# Patient Record
Sex: Female | Born: 1990 | Race: Black or African American | Hispanic: No | Marital: Single | Smoking: Never smoker
Health system: Southern US, Community
[De-identification: ages and names within clinical notes are randomized; demographics above are authoritative.]

## PROBLEM LIST (undated history)

## (undated) ENCOUNTER — Inpatient Hospital Stay (HOSPITAL_COMMUNITY): Payer: Self-pay

## (undated) DIAGNOSIS — Z8619 Personal history of other infectious and parasitic diseases: Secondary | ICD-10-CM

## (undated) DIAGNOSIS — Z789 Other specified health status: Secondary | ICD-10-CM

## (undated) HISTORY — PX: NO PAST SURGERIES: SHX2092

---
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
## 2014-03-16 ENCOUNTER — Ambulatory Visit (INDEPENDENT_AMBULATORY_CARE_PROVIDER_SITE_OTHER): Payer: No Typology Code available for payment source | Admitting: Family Medicine

## 2014-03-16 VITALS — BP 120/70 | HR 67 | Temp 97.9°F | Resp 16 | Ht 64.0 in | Wt 153.0 lb

## 2014-03-16 DIAGNOSIS — Z3009 Encounter for other general counseling and advice on contraception: Secondary | ICD-10-CM

## 2014-03-16 DIAGNOSIS — Z0000 Encounter for general adult medical examination without abnormal findings: Secondary | ICD-10-CM

## 2014-03-16 DIAGNOSIS — Z113 Encounter for screening for infections with a predominantly sexual mode of transmission: Secondary | ICD-10-CM

## 2014-03-16 DIAGNOSIS — N644 Mastodynia: Secondary | ICD-10-CM

## 2014-03-16 DIAGNOSIS — Z30011 Encounter for initial prescription of contraceptive pills: Secondary | ICD-10-CM

## 2014-03-16 LAB — POCT UA - MICROSCOPIC ONLY
Casts, Ur, LPF, POC: NEGATIVE
Crystals, Ur, HPF, POC: NEGATIVE
Yeast, UA: NEGATIVE

## 2014-03-16 LAB — POCT URINALYSIS DIPSTICK
Bilirubin, UA: NEGATIVE
Blood, UA: NEGATIVE
GLUCOSE UA: NEGATIVE
KETONES UA: NEGATIVE
Leukocytes, UA: NEGATIVE
Nitrite, UA: NEGATIVE
Protein, UA: NEGATIVE
Spec Grav, UA: 1.02
Urobilinogen, UA: 0.2
pH, UA: 6.5

## 2014-03-16 MED ORDER — NORGESTIMATE-ETH ESTRADIOL 0.25-35 MG-MCG PO TABS
1.0000 | ORAL_TABLET | Freq: Every day | ORAL | Status: DC
Start: 2014-03-16 — End: 2016-05-20

## 2014-03-16 NOTE — Patient Instructions (Addendum)
Plantar Fasciitis Plantar fasciitis is a common condition that causes foot pain. It is soreness (inflammation) of the band of tough fibrous tissue on the bottom of the foot that runs from the heel bone (calcaneus) to the ball of the foot. The cause of this soreness may be from excessive standing, poor fitting shoes, running on hard surfaces, being overweight, having an abnormal walk, or overuse (this is common in runners) of the painful foot or feet. It is also common in aerobic exercise dancers and ballet dancers. SYMPTOMS  Most people with plantar fasciitis complain of:  Severe pain in the morning on the bottom of their foot especially when taking the first steps out of bed. This pain recedes after a few minutes of walking.  Severe pain is experienced also during walking following a long period of inactivity.  Pain is worse when walking barefoot or up stairs DIAGNOSIS   Your caregiver will diagnose this condition by examining and feeling your foot.  Special tests such as X-rays of your foot, are usually not needed. PREVENTION   Consult a sports medicine professional before beginning a new exercise program.  Walking programs offer a good workout. With walking there is a lower chance of overuse injuries common to runners. There is less impact and less jarring of the joints.  Begin all new exercise programs slowly. If problems or pain develop, decrease the amount of time or distance until you are at a comfortable level.  Wear good shoes and replace them regularly.  Stretch your foot and the heel cords at the back of the ankle (Achilles tendon) both before and after exercise.  Run or exercise on even surfaces that are not hard. For example, asphalt is better than pavement.  Do not run barefoot on hard surfaces.  If using a treadmill, vary the incline.  Do not continue to workout if you have foot or joint problems. Seek professional help if they do not improve. HOME CARE INSTRUCTIONS     Avoid activities that cause you pain until you recover.  Use ice or cold packs on the problem or painful areas after working out.  Only take over-the-counter or prescription medicines for pain, discomfort, or fever as directed by your caregiver.  Soft shoe inserts or athletic shoes with air or gel sole cushions may be helpful.  If problems continue or become more severe, consult a sports medicine caregiver or your own health care provider. Cortisone is a potent anti-inflammatory medication that may be injected into the painful area. You can discuss this treatment with your caregiver. MAKE SURE YOU:   Understand these instructions.  Will watch your condition.  Will get help right away if you are not doing well or get worse. Document Released: 05/09/2001 Document Revised: 11/06/2011 Document Reviewed: 07/08/2008 [facility] Patient Information 2015 [place], [place]. This information is not intended to replace advice given to you by your health care provider. Make sure you discuss any questions you have with your health care provider. Oral Contraception Use Oral contraceptive pills (OCPs) are medicines taken to prevent pregnancy. OCPs work by preventing the ovaries from releasing eggs. The hormones in OCPs also cause the cervical mucus to thicken, preventing the sperm from entering the uterus. The hormones also cause the uterine lining to become thin, not allowing a fertilized egg to attach to the inside of the uterus. OCPs are highly effective when taken exactly as prescribed. However, OCPs do not prevent sexually transmitted diseases (STDs). Safe sex practices, such as using condoms along  with an OCP, can help prevent STDs. Before taking OCPs, you may have a physical exam and Pap test. Your health care provider may also order blood tests if necessary. Your health care provider will make sure you are a good candidate for oral contraception. Discuss with your health care provider the possible side  effects of the OCP you may be prescribed. When starting an OCP, it can take 2 to 3 months for the body to adjust to the changes in hormone levels in your body.  HOW TO TAKE ORAL CONTRACEPTIVE PILLS Your health care provider may advise you on how to start taking the first cycle of OCPs. Otherwise, you can:   Start on day 1 of your menstrual period. You will not need any backup contraceptive protection with this start time.   Start on the first Sunday after your menstrual period or the day you get your prescription. In these cases, you will need to use backup contraceptive protection for the first week.   Start the pill at any time of your cycle. If you take the pill within 5 days of the start of your period, you are protected against pregnancy right away. In this case, you will not need a backup form of birth control. If you start at any other time of your menstrual cycle, you will need to use another form of birth control for 7 days. If your OCP is the type called a minipill, it will protect you from pregnancy after taking it for 2 days (48 hours). After you have started taking OCPs:   If you forget to take 1 pill, take it as soon as you remember. Take the next pill at the regular time.   If you miss 2 or more pills, call your health care provider because different pills have different instructions for missed doses. Use backup birth control until your next menstrual period starts.   If you use a 28-day pack that contains inactive pills and you miss 1 of the last 7 pills (pills with no hormones), it will not matter. Throw away the rest of the nonhormone pills and start a new pill pack.  No matter which day you start the OCP, you will always start a new pack on that same day of the week. Have an extra pack of OCPs and a backup contraceptive method available in case you miss some pills or lose your OCP pack.  HOME CARE INSTRUCTIONS   Do not smoke.   Always use a condom to protect against  STDs. OCPs do not protect against STDs.   Use a calendar to mark your menstrual period days.   Read the information and directions that came with your OCP. Talk to your health care provider if you have questions.  SEEK MEDICAL CARE IF:   You develop nausea and vomiting.   You have abnormal vaginal discharge or bleeding.   You develop a rash.   You miss your menstrual period.   You are losing your hair.   You need treatment for mood swings or depression.   You get dizzy when taking the OCP.   You develop acne from taking the OCP.   You become pregnant.  SEEK IMMEDIATE MEDICAL CARE IF:   You develop chest pain.   You develop shortness of breath.   You have an uncontrolled or severe headache.   You develop numbness or slurred speech.   You develop visual problems.   You develop pain, redness, and swelling in the legs.  Document Released: 08/03/2011 Document Revised: 04/16/2013 Document Reviewed: 02/02/2013 [facility] Patient Information 2015 [place], [place]. This information is not intended to replace advice given to you by your health care provider. Make sure you discuss any questions you have with your health care provider.

## 2014-03-16 NOTE — Progress Notes (Signed)
   Subjective:    Patient ID: Crystal Church, female    DOB: 1991-04-27, 23 y.o.   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 She is interested in restarting OCP. She has been with her boyfriend for 1.5 years. She had testing for STI in the past and had gonorrhea. She is interested in being tested today. She had a normal PAP smear 3 years ago.  She has a history of plantar fascitis and has had a flare of left foot after walking around [place] in flip flops. She is aware of symptomatic treatment like ice/stretching/NSAIDs, but has not used any of these.  She reports painful breasts for a long time, right worse than left. Her breast pain is not consistently related to her menstrual cycle. She reports having clinical breast exams and checking her own breast, but not being sure what she is feeling. She is interested in having mammogram. She has an aunt who was diagnosed with breast cancer in her 6620s, and is very concerned that she may have cancer.  Leavy CellaJasmine is studying recreational therapy [employer] and she works at [employer], assisting with tours and orientation. She cooks some meals at her apartment and eats fast food frequently. She rarely exercise.  PMH- seasonal allergies PSH- none FH- parents alive and well SH- No tob, no ETOH, no illicit drugs.   Review of Systems Occasional headaches relieved with ibuprofen, no dysuria/frequency/hematuria, no vaginal discharge/odor/itching/pain, no abdominal pain/nausea/vomiting/diarrhea/constipation.    Objective:   Physical Exam  Vitals reviewed. Constitutional: She is oriented to person, place, and time. She appears well-developed and well-nourished.  HENT:  Head: Normocephalic and atraumatic.  Right Ear: Tympanic membrane, external ear and ear canal normal.  Left Ear: Tympanic membrane, external ear and ear canal normal.  Nose: Nose normal.  Mouth/Throat: Oropharynx is clear and moist.    Eyes: Conjunctivae are normal. Pupils are equal, round, and reactive to light. Right eye exhibits no discharge. Left eye exhibits no discharge.  Neck: Normal range of motion. Neck supple. No thyromegaly present.  Cardiovascular: Normal rate, regular rhythm and normal heart sounds.   Pulmonary/Chest: Effort normal and breath sounds normal. Right breast exhibits no inverted nipple, no mass, no nipple discharge, no skin change and no tenderness. Left breast exhibits no inverted nipple, no mass, no nipple discharge, no skin change and no tenderness. Breasts are symmetrical.  Abdominal: Soft. Bowel sounds are normal.  Musculoskeletal: Normal range of motion.  Lymphadenopathy:    She has no cervical adenopathy.  Neurological: She is alert and oriented to person, place, and time.  Skin: Skin is warm and dry.  Psychiatric: She has a normal mood and affect. Her behavior is normal. Judgment and thought content normal.      Assessment & Plan:  1. Routine screening for STI (sexually transmitted infection) - HIV antibody - GC/Chlamydia Probe Amp - RPR  2. Annual physical exam - POCT urinalysis dipstick - POCT UA - Microscopic Only  3. Encounter for initial prescription of contraceptive pills - norgestimate-ethinyl estradiol (ORTHO-CYCLEN,SPRINTEC,PREVIFEM) 0.25-35 MG-MCG tablet; Take 1 tablet by mouth daily.  Dispense: 3 Package; Refill: 4  4. Pain of both breasts - US Breast Bilateral; Future  -Encouraged healthy habits- exercise 3-4x a week, cook more, less fast food.  Emi Belfasteborah B. Gessner, FNP-BC  [facility]  03/17/2014 8:19 AM

## 2014-03-17 LAB — GC/CHLAMYDIA PROBE AMP
CT PROBE, AMP APTIMA: NEGATIVE
GC PROBE AMP APTIMA: NEGATIVE

## 2014-03-17 LAB — HIV ANTIBODY (ROUTINE TESTING W REFLEX): HIV: NONREACTIVE

## 2014-03-17 LAB — RPR

## 2014-03-17 NOTE — Addendum Note (Signed)
Addended by: Eddie CandleLUCK, Tiegan Terpstra L on: 03/17/2014 03:29 PM   Modules accepted: Orders

## 2014-03-19 ENCOUNTER — Other Ambulatory Visit: Payer: Self-pay | Admitting: Family Medicine

## 2014-03-19 DIAGNOSIS — N644 Mastodynia: Secondary | ICD-10-CM

## 2015-12-24 LAB — OB RESULTS CONSOLE HIV ANTIBODY (ROUTINE TESTING): HIV: NONREACTIVE

## 2015-12-24 LAB — OB RESULTS CONSOLE HEPATITIS B SURFACE ANTIGEN: Hepatitis B Surface Ag: NEGATIVE

## 2015-12-24 LAB — OB RESULTS CONSOLE GC/CHLAMYDIA
CHLAMYDIA, DNA PROBE: NEGATIVE
GC PROBE AMP, GENITAL: NEGATIVE

## 2015-12-24 LAB — OB RESULTS CONSOLE RPR: RPR: NONREACTIVE

## 2015-12-24 LAB — OB RESULTS CONSOLE ABO/RH: "RH Type ": POSITIVE

## 2015-12-24 LAB — OB RESULTS CONSOLE ANTIBODY SCREEN: ANTIBODY SCREEN: NEGATIVE

## 2015-12-24 LAB — OB RESULTS CONSOLE RUBELLA ANTIBODY, IGM: Rubella: IMMUNE

## 2016-05-19 ENCOUNTER — Inpatient Hospital Stay (HOSPITAL_COMMUNITY)
Admission: AD | Admit: 2016-05-19 | Discharge: 2016-05-20 | Disposition: A | Discharge status: Home / Self Care | Payer: BLUE CROSS/BLUE SHIELD | Source: Ambulatory Visit | Attending: Obstetrics and Gynecology | Admitting: Obstetrics and Gynecology

## 2016-05-19 ENCOUNTER — Encounter (HOSPITAL_COMMUNITY): Payer: Self-pay | Admitting: *Deleted

## 2016-05-19 DIAGNOSIS — O26893 Other specified pregnancy related conditions, third trimester: Secondary | ICD-10-CM | POA: Diagnosis not present

## 2016-05-19 DIAGNOSIS — R519 Headache, unspecified: Secondary | ICD-10-CM

## 2016-05-19 DIAGNOSIS — R51 Headache: Secondary | ICD-10-CM | POA: Insufficient documentation

## 2016-05-19 DIAGNOSIS — Z3A30 30 weeks gestation of pregnancy: Secondary | ICD-10-CM | POA: Diagnosis not present

## 2016-05-19 HISTORY — DX: Personal history of other infectious and parasitic diseases: Z86.19

## 2016-05-19 LAB — URINALYSIS, ROUTINE W REFLEX MICROSCOPIC
Bilirubin Urine: NEGATIVE
Glucose, UA: 500 mg/dL — AB
Hgb urine dipstick: NEGATIVE
Ketones, ur: NEGATIVE mg/dL
LEUKOCYTES UA: NEGATIVE
NITRITE: NEGATIVE
PH: 6 (ref 5.0–8.0)
Protein, ur: NEGATIVE mg/dL
SPECIFIC GRAVITY, URINE: 1.02 (ref 1.005–1.030)

## 2016-05-19 MED ORDER — ACETAMINOPHEN 500 MG PO TABS
1000.0000 mg | ORAL_TABLET | Freq: Once | ORAL | Status: AC
  Administered 2016-05-20: 1000 mg via ORAL
  Filled 2016-05-19: qty 2

## 2016-05-19 NOTE — MAU Note (Signed)
Started a wk ago THurs and I felt loss of energy and weak. Monday and Tues I vomited some. Today I had loss of energy again, really hungry, tired. Denies LOF or bleeding. Had some abd pain off and on that were sharp but not now. Just have a headache

## 2016-05-19 NOTE — MAU Provider Note (Signed)
History     CSN: [id number]  Arrival date and time: 05/19/16 2240   First Provider Initiated Contact with Patient 05/19/16 2339      Chief Complaint  Patient presents with  . Headache   Crystal Church is a 25 y.o. G1P0 at [redacted]w[redacted]d who presents with headache. Reports recurrent episodes of headache and fatigue for the last week. Current symptoms began this morning. Denies contractions, abdominal pain, vaginal bleeding, or LOF. Positive fetal movement. Denies complications with this pregnancy.    Headache   This is a recurrent problem. The current episode started today. The problem occurs constantly. The problem has been unchanged. The pain is located in the frontal region. The pain does not radiate. The quality of the pain is described as aching. The pain is at a severity of 9/10. Associated symptoms include photophobia. Pertinent negatives include no abdominal pain, blurred vision, coughing, fever, nausea, neck pain, phonophobia, vomiting or weakness. The symptoms are aggravated by bright light. She has tried nothing for the symptoms. There is no history of hypertension, migraine headaches or recent head traumas.    OB History    Gravida Para Term Preterm AB Living   1             SAB TAB Ectopic Multiple Live Births                  Past Medical History:  Diagnosis Date  . Allergy   . Medical history non-contributory     Past Surgical History:  Procedure Laterality Date  . NO PAST SURGERIES      Family History  Problem Relation Age of Onset  . Heart disease Maternal Grandmother   . Heart disease Maternal Grandfather     Social History  Substance Use Topics  . Smoking status: Never Smoker  . Smokeless tobacco: Never Used  . Alcohol use No    Allergies: No Known Allergies  Prescriptions Prior to Admission  Medication Sig Dispense Refill Last Dose  . norgestimate-ethinyl estradiol (ORTHO-CYCLEN,SPRINTEC,PREVIFEM) 0.25-35 MG-MCG tablet Take 1 tablet by mouth daily. 3  Package 4     Review of Systems  Constitutional: Positive for malaise/fatigue. Negative for chills and fever.  Eyes: Positive for photophobia. Negative for blurred vision.  Respiratory: Negative for cough.   Cardiovascular: Negative.   Gastrointestinal: Negative.  Negative for abdominal pain, nausea and vomiting.  Genitourinary: Negative.   Musculoskeletal: Negative for neck pain.  Neurological: Positive for headaches. Negative for weakness.   Physical Exam   Blood pressure 120/77, pulse 115, temperature 98.3 F (36.8 C), resp. rate 18, SpO2 100 %.  Physical Exam  Nursing note and vitals reviewed. Constitutional: She is oriented to person, place, and time. She appears well-developed and well-nourished. No distress.  HENT:  Head: Normocephalic and atraumatic.  Eyes: Conjunctivae are normal. Right eye exhibits no discharge. Left eye exhibits no discharge. No scleral icterus.  Neck: Normal range of motion.  Cardiovascular: Normal rate, regular rhythm and normal heart sounds.   No murmur heard. Respiratory: Effort normal and breath sounds normal. No respiratory distress. She has no wheezes.  GI: Soft. Bowel sounds are normal. There is no tenderness.  Neurological: She is alert and oriented to person, place, and time.  Skin: Skin is warm and dry. She is not diaphoretic.  Psychiatric: She has a normal mood and affect. Her behavior is normal. Judgment and thought content normal.   Fetal Tracing:  Baseline: 140 Variability: moderate Accelerations: 15x15 Decelerations: 1 small variable at  beginning of tracing  Toco: none   MAU Course  Procedures Results for orders placed or performed during the hospital encounter of 05/19/16 (from the past 24 hour(s))  Urinalysis, Routine w reflex microscopic ([facility])     Status: Abnormal   Collection Time: 05/19/16 11:00 PM  Result Value Ref Range   Color, Urine YELLOW YELLOW   APPearance CLEAR CLEAR   Specific Gravity, Urine 1.020  1.005 - 1.030   pH 6.0 5.0 - 8.0   Glucose, UA 500 (A) NEGATIVE mg/dL   Hgb urine dipstick NEGATIVE NEGATIVE   Bilirubin Urine NEGATIVE NEGATIVE   Ketones, ur NEGATIVE NEGATIVE mg/dL   Protein, ur NEGATIVE NEGATIVE mg/dL   Nitrite NEGATIVE NEGATIVE   Leukocytes, UA NEGATIVE NEGATIVE  CBC     Status: Abnormal   Collection Time: 05/19/16 11:53 PM  Result Value Ref Range   WBC 14.4 (H) 4.0 - 10.5 K/uL   RBC 3.87 3.87 - 5.11 MIL/uL   Hemoglobin 10.3 (L) 12.0 - 15.0 g/dL   HCT 16.131.1 (L) 09.636.0 - 04.546.0 %   MCV 80.4 78.0 - 100.0 fL   MCH 26.6 26.0 - 34.0 pg   MCHC 33.1 30.0 - 36.0 g/dL   RDW 40.914.2 81.111.5 - 91.415.5 %   Platelets 200 150 - 400 K/uL  Glucose, capillary     Status: Abnormal   Collection Time: 05/20/16 12:21 AM  Result Value Ref Range   Glucose-Capillary 120 (H) 65 - 99 mg/dL    MDM Reactive tracing Tylenol 1 gm PO CBC U/a shows 500 glucose -- CBG 120 Pt reports marked improvement in headache -- now rating 1/10 Unable to speak directly with Dr. Mindi SlickerBanga as she was in a delivery -- pt requesting to go home. Patient stable for discharge.  Assessment and Plan  A: 1. Headache in pregnancy, third trimester     P: Discharge home Take tylenol prn headache  Discussed reasons to return to MAU Keep f/u with OB  Crystal Church 05/19/2016, 11:38 PM

## 2016-05-20 DIAGNOSIS — R51 Headache: Secondary | ICD-10-CM | POA: Diagnosis not present

## 2016-05-20 DIAGNOSIS — O26893 Other specified pregnancy related conditions, third trimester: Secondary | ICD-10-CM | POA: Diagnosis not present

## 2016-05-20 LAB — GLUCOSE, CAPILLARY: Glucose-Capillary: 120 mg/dL — ABNORMAL HIGH (ref 65–99)

## 2016-05-20 LAB — CBC
HEMATOCRIT: 31.1 % — AB (ref 36.0–46.0)
HEMOGLOBIN: 10.3 g/dL — AB (ref 12.0–15.0)
MCH: 26.6 pg (ref 26.0–34.0)
MCHC: 33.1 g/dL (ref 30.0–36.0)
MCV: 80.4 fL (ref 78.0–100.0)
Platelets: 200 10*3/uL (ref 150–400)
RBC: 3.87 MIL/uL (ref 3.87–5.11)
RDW: 14.2 % (ref 11.5–15.5)
WBC: 14.4 10*3/uL — ABNORMAL HIGH (ref 4.0–10.5)

## 2016-05-20 NOTE — Discharge Instructions (Signed)

## 2016-06-08 ENCOUNTER — Encounter (HOSPITAL_COMMUNITY): Payer: Self-pay

## 2016-06-08 ENCOUNTER — Inpatient Hospital Stay (HOSPITAL_COMMUNITY)
Admission: AD | Admit: 2016-06-08 | Discharge: 2016-06-08 | Disposition: A | Discharge status: Home / Self Care | Payer: BLUE CROSS/BLUE SHIELD | Source: Ambulatory Visit | Attending: Obstetrics and Gynecology | Admitting: Obstetrics and Gynecology

## 2016-06-08 DIAGNOSIS — O9989 Other specified diseases and conditions complicating pregnancy, childbirth and the puerperium: Secondary | ICD-10-CM

## 2016-06-08 DIAGNOSIS — O4703 False labor before 37 completed weeks of gestation, third trimester: Secondary | ICD-10-CM | POA: Diagnosis not present

## 2016-06-08 DIAGNOSIS — Z3A33 33 weeks gestation of pregnancy: Secondary | ICD-10-CM | POA: Diagnosis not present

## 2016-06-08 DIAGNOSIS — M549 Dorsalgia, unspecified: Secondary | ICD-10-CM | POA: Diagnosis not present

## 2016-06-08 HISTORY — DX: Other specified health status: Z78.9

## 2016-06-08 LAB — AMNISURE RUPTURE OF MEMBRANE (ROM) NOT AT ARMC: AMNISURE: NEGATIVE

## 2016-06-08 LAB — URINE MICROSCOPIC-ADD ON

## 2016-06-08 LAB — URINALYSIS, ROUTINE W REFLEX MICROSCOPIC
BILIRUBIN URINE: NEGATIVE
Glucose, UA: NEGATIVE mg/dL
Ketones, ur: NEGATIVE mg/dL
NITRITE: NEGATIVE
Protein, ur: NEGATIVE mg/dL
SPECIFIC GRAVITY, URINE: 1.015 (ref 1.005–1.030)
pH: 6 (ref 5.0–8.0)

## 2016-06-08 LAB — WET PREP, GENITAL
Clue Cells Wet Prep HPF POC: NONE SEEN
Sperm: NONE SEEN
Trich, Wet Prep: NONE SEEN
YEAST WET PREP: NONE SEEN

## 2016-06-08 LAB — FETAL FIBRONECTIN: FETAL FIBRONECTIN: NEGATIVE

## 2016-06-08 MED ORDER — CYCLOBENZAPRINE HCL 10 MG PO TABS
10.0000 mg | ORAL_TABLET | Freq: Once | ORAL | Status: AC
  Administered 2016-06-08: 10 mg via ORAL
  Filled 2016-06-08: qty 1

## 2016-06-08 MED ORDER — NIFEDIPINE 10 MG PO CAPS
10.0000 mg | ORAL_CAPSULE | Freq: Once | ORAL | Status: AC
  Administered 2016-06-08: 10 mg via ORAL
  Filled 2016-06-08: qty 1

## 2016-06-08 MED ORDER — LACTATED RINGERS IV BOLUS (SEPSIS)
1000.0000 mL | Freq: Once | INTRAVENOUS | Status: AC
  Administered 2016-06-08: 1000 mL via INTRAVENOUS

## 2016-06-08 NOTE — MAU Note (Signed)
Pt presents to MAU with complaints of leakage of fluid since last night with contractions since Tuesday night. Denies any vaginal bleeding. Reports multiple stools over the last couple of days.

## 2016-06-08 NOTE — Discharge Instructions (Signed)
Back Exercises °If you have pain in your back, do these exercises 2-3 times each day or as told by your doctor. When the pain goes away, do the exercises once each day, but repeat the steps more times for each exercise (do more repetitions). If you do not have pain in your back, do these exercises once each day or as told by your doctor. °EXERCISES °Single Knee to Chest °Do these steps 3-5 times in a row for each leg: °1. Lie on your back on a firm bed or the floor with your legs stretched out. °2. Bring one knee to your chest. °3. Hold your knee to your chest by grabbing your knee or thigh. °4. Pull on your knee until you feel a gentle stretch in your lower back. °5. Keep doing the stretch for 10-30 seconds. °6. Slowly let go of your leg and straighten it. °Pelvic Tilt °Do these steps 5-10 times in a row: °1. Lie on your back on a firm bed or the floor with your legs stretched out. °2. Bend your knees so they point up to the ceiling. Your feet should be flat on the floor. °3. Tighten your lower belly (abdomen) muscles to press your lower back against the floor. This will make your tailbone point up to the ceiling instead of pointing down to your feet or the floor. °4. Stay in this position for 5-10 seconds while you gently tighten your muscles and breathe evenly. °Cat-Cow °Do these steps until your lower back bends more easily: °1. Get on your hands and knees on a firm surface. Keep your hands under your shoulders, and keep your knees under your hips. You may put padding under your knees. °2. Let your head hang down, and make your tailbone point down to the floor so your lower back is round like the back of a cat. °3. Stay in this position for 5 seconds. °4. Slowly lift your head and make your tailbone point up to the ceiling so your back hangs low (sags) like the back of a cow. °5. Stay in this position for 5 seconds. °Press-Ups °Do these steps 5-10 times in a row: °1. Lie on your belly (face-down) on the  floor. °2. Place your hands near your head, about shoulder-width apart. °3. While you keep your back relaxed and keep your hips on the floor, slowly straighten your arms to raise the top half of your body and lift your shoulders. Do not use your back muscles. To make yourself more comfortable, you may change where you place your hands. °4. Stay in this position for 5 seconds. °5. Slowly return to lying flat on the floor. °Bridges °Do these steps 10 times in a row: °1. Lie on your back on a firm surface. °2. Bend your knees so they point up to the ceiling. Your feet should be flat on the floor. °3. Tighten your butt muscles and lift your butt off of the floor until your waist is almost as high as your knees. If you do not feel the muscles working in your butt and the back of your thighs, slide your feet 1-2 inches farther away from your butt. °4. Stay in this position for 3-5 seconds. °5. Slowly lower your butt to the floor, and let your butt muscles relax. °If this exercise is too easy, try doing it with your arms crossed over your chest. °Belly Crunches °Do these steps 5-10 times in a row: °1. Lie on your back on a firm bed   or the floor with your legs stretched out. 2. Bend your knees so they point up to the ceiling. Your feet should be flat on the floor. 3. Cross your arms over your chest. 4. Tip your chin a little bit toward your chest but do not bend your neck. 5. Tighten your belly muscles and slowly raise your chest just enough to lift your shoulder blades a tiny bit off of the floor. 6. Slowly lower your chest and your head to the floor. Back Lifts Do these steps 5-10 times in a row: 1. Lie on your belly (face-down) with your arms at your sides, and rest your forehead on the floor. 2. Tighten the muscles in your legs and your butt. 3. Slowly lift your chest off of the floor while you keep your hips on the floor. Keep the back of your head in line with the curve in your back. Look at the floor while  you do this. 4. Stay in this position for 3-5 seconds. 5. Slowly lower your chest and your face to the floor. GET HELP IF:  Your back pain gets a lot worse when you do an exercise.  Your back pain does not lessen 2 hours after you exercise. If you have any of these problems, stop doing the exercises. Do not do them again unless your doctor says it is okay. GET HELP RIGHT AWAY IF:  You have sudden, very bad back pain. If this happens, stop doing the exercises. Do not do them again unless your doctor says it is okay.   This information is not intended to replace advice given to you by your health care provider. Make sure you discuss any questions you have with your health care provider.   Document Released: 09/16/2010 Document Revised: 05/05/2015 Document Reviewed: 10/08/2014 Elsevier Interactive Patient Education 2016 Elsevier Inc.   Abdominal Pain During Pregnancy Belly (abdominal) pain is common during pregnancy. Most of the time, it is not a serious problem. Other times, it can be a sign that something is wrong with the pregnancy. Always tell your doctor if you have belly pain. HOME CARE Monitor your belly pain for any changes. The following actions may help you feel better:  Do not have sex (intercourse) or put anything in your vagina until you feel better.  Rest until your pain stops.  Drink clear fluids if you feel sick to your stomach (nauseous). Do not eat solid food until you feel better.  Only take medicine as told by your doctor.  Keep all doctor visits as told. GET HELP RIGHT AWAY IF:   You are bleeding, leaking fluid, or pieces of tissue come out of your vagina.  You have more pain or cramping.  You keep throwing up (vomiting).  You have pain when you pee (urinate) or have blood in your pee.  You have a fever.  You do not feel your baby moving as much.  You feel very weak or feel like passing out.  You have trouble breathing, with or without belly  pain.  You have a very bad headache and belly pain.  You have fluid leaking from your vagina and belly pain.  You keep having watery poop (diarrhea).  Your belly pain does not go away after resting, or the pain gets worse. MAKE SURE YOU:   Understand these instructions.  Will watch your condition.  Will get help right away if you are not doing well or get worse.   This information is not intended to  replace advice given to you by your health care provider. Make sure you discuss any questions you have with your health care provider.   Document Released: 08/02/2009 Document Revised: 04/16/2013 Document Reviewed: 03/13/2013 Elsevier Interactive Patient Education [employer]

## 2016-06-08 NOTE — MAU Provider Note (Signed)
Chief Complaint:  Rupture of Membranes and Contractions   First Provider Initiated Contact with Patient 06/08/16 1654      HPI: Crystal Church is a 25 y.o. G1P0 at [id number] who presents to maternity admissions reporting onset of cramping/contractions 5-6x /hour 2 days ago with worsening contractions and leakage of some clear fluid with contractions last night. She also reports 4-5 bowel movements in last 24 hours but no diarrhea.  She describes the cramping as intermittent, unchanged since onset, and unrelieved by rest, heat, position change, or Tylenol.  She has not tried any treatments for the leakage which is clear fluid enough to make her underwear damp but not requiring a pad.  She reports good fetal movement, denies vaginal bleeding, vaginal itching/burning, urinary symptoms, h/a, dizziness, n/v, or fever/chills.    HPI  Past Medical History: Past Medical History:  Diagnosis Date  . Hx of gonorrhea 2012  . Medical history non-contributory     Past obstetric history: OB History  Gravida Para Term Preterm AB Living  1            SAB TAB Ectopic Multiple Live Births               # Outcome Date GA Lbr Len/2nd Weight Sex Delivery Anes PTL Lv  1 Current               Past Surgical History: Past Surgical History:  Procedure Laterality Date  . NO PAST SURGERIES      Family History: Family History  Problem Relation Age of Onset  . Heart disease Maternal Grandmother   . Heart disease Maternal Grandfather     Social History: Social History  Substance Use Topics  . Smoking status: Never Smoker  . Smokeless tobacco: Never Used  . Alcohol use No    Allergies: No Known Allergies  Meds:  Prescriptions Prior to Admission  Medication Sig Dispense Refill Last Dose  . acetaminophen (TYLENOL) 500 MG tablet Take 1,000 mg by mouth every 6 (six) hours as needed for mild pain or headache.   Past Month at Unknown time    ROS:  Review of Systems  Constitutional: Negative for  chills, fatigue and fever.  HENT: Negative for sinus pressure.   Eyes: Negative for photophobia.  Respiratory: Negative for shortness of breath.   Cardiovascular: Negative for chest pain.  Gastrointestinal: Positive for abdominal pain. Negative for constipation, diarrhea, nausea and vomiting.  Genitourinary: Positive for pelvic pain. Negative for difficulty urinating, dysuria, flank pain, frequency, vaginal bleeding, vaginal discharge and vaginal pain.  Musculoskeletal: Positive for back pain. Negative for neck pain.  Neurological: Negative for dizziness, weakness and headaches.  Psychiatric/Behavioral: Negative.      I have reviewed patient's Past Medical Hx, Surgical Hx, Family Hx, Social Hx, medications and allergies.   Physical Exam  Patient Vitals for the past 24 hrs:  BP Temp Pulse Resp SpO2 Height Weight  06/08/16 1601 - - 117 - 100 % 5\' 4"  (1.626 m) 186 lb (84.4 kg)  06/08/16 1550 126/70 98.5 F (36.9 C) (!) 123 18 - - 186 lb (84.4 kg)   Constitutional: Well-developed, well-nourished female in no acute distress.  Cardiovascular: normal rate Respiratory: normal effort GI: Abd soft, non-tender, gravid appropriate for gestational age.  MS: Extremities nontender, no edema, normal ROM Neurologic: Alert and oriented x 4.  GU: Neg CVAT.  PELVIC EXAM: Cervix pink, visually closed, without lesion, scant white creamy discharge, vaginal walls and external genitalia normal  Cervix closed/thick/high/posterior    FHT:  Baseline 135, moderate variability, accelerations present, no decelerations Contractions: q 1.5-2 mins   Labs: Results for orders placed or performed during the hospital encounter of 06/08/16 (from the past 24 hour(s))  Urinalysis, Routine w reflex microscopic (not at [facility])     Status: Abnormal   Collection Time: 06/08/16  3:50 PM  Result Value Ref Range   Color, Urine YELLOW YELLOW   APPearance CLEAR CLEAR   Specific Gravity, Urine 1.015 1.005 - 1.030   pH  6.0 5.0 - 8.0   Glucose, UA NEGATIVE NEGATIVE mg/dL   Hgb urine dipstick TRACE (A) NEGATIVE   Bilirubin Urine NEGATIVE NEGATIVE   Ketones, ur NEGATIVE NEGATIVE mg/dL   Protein, ur NEGATIVE NEGATIVE mg/dL   Nitrite NEGATIVE NEGATIVE   Leukocytes, UA SMALL (A) NEGATIVE  Urine microscopic-add on     Status: Abnormal   Collection Time: 06/08/16  3:50 PM  Result Value Ref Range   Squamous Epithelial / LPF 6-30 (A) NONE SEEN   WBC, UA 0-5 0 - 5 WBC/hpf   RBC / HPF 0-5 0 - 5 RBC/hpf   Bacteria, UA RARE (A) NONE SEEN  Amnisure rupture of membrane (rom)not at [facility]     Status: None   Collection Time: 06/08/16  5:00 PM  Result Value Ref Range   Amnisure ROM NEGATIVE   Wet prep, genital     Status: Abnormal   Collection Time: 06/08/16  5:00 PM  Result Value Ref Range   Yeast Wet Prep HPF POC NONE SEEN NONE SEEN   Trich, Wet Prep NONE SEEN NONE SEEN   Clue Cells Wet Prep HPF POC NONE SEEN NONE SEEN   WBC, Wet Prep HPF POC FEW (A) NONE SEEN   Sperm NONE SEEN   Fetal fibronectin     Status: None   Collection Time: 06/08/16  5:00 PM  Result Value Ref Range   Fetal Fibronectin NEGATIVE NEGATIVE      Imaging:  No results found.  MAU Course/MDM: Pt tachycardic and FHR elevated initially so LR x 1000 ml given. FFN, amnisure, wet prep collected and pending  Report to Venia Carbon, NP  Flexeril given 10 mg, procardia 10 mg PO Cervix is unchanged  Patient reports pain is 5/10 @ the time of discharge. Likely musculoskeletal pain. Unlikely preterm labor Discussed HPI, fetal tracing and physical exam with Dr. Ellyn Hack ok for discharge.   Sharen Counter Certified Nurse-Midwife 06/08/2016 5:02 PM     A:  1. Back pain in pregnancy   2. Threatened premature labor in third trimester     P:  Discharge home in stable condition Strict return precautions Follow up with OB as scheduled or sooner if needed Fetal kick counts Preterm labor precautions Try heat at the pain  source  Duane Lope, NP 06/08/2016 8:59 PM

## 2016-06-10 ENCOUNTER — Inpatient Hospital Stay (HOSPITAL_COMMUNITY)
Admission: AD | Admit: 2016-06-10 | Discharge: 2016-06-11 | Disposition: A | Discharge status: Home / Self Care | Payer: BLUE CROSS/BLUE SHIELD | Source: Ambulatory Visit | Attending: Obstetrics and Gynecology | Admitting: Obstetrics and Gynecology

## 2016-06-10 ENCOUNTER — Encounter (HOSPITAL_COMMUNITY): Payer: Self-pay | Admitting: *Deleted

## 2016-06-10 DIAGNOSIS — Z3A33 33 weeks gestation of pregnancy: Secondary | ICD-10-CM | POA: Insufficient documentation

## 2016-06-10 DIAGNOSIS — O4703 False labor before 37 completed weeks of gestation, third trimester: Secondary | ICD-10-CM | POA: Diagnosis not present

## 2016-06-10 DIAGNOSIS — O99891 Other specified diseases and conditions complicating pregnancy: Secondary | ICD-10-CM

## 2016-06-10 DIAGNOSIS — M549 Dorsalgia, unspecified: Secondary | ICD-10-CM

## 2016-06-10 DIAGNOSIS — O9989 Other specified diseases and conditions complicating pregnancy, childbirth and the puerperium: Secondary | ICD-10-CM

## 2016-06-10 LAB — URINALYSIS, ROUTINE W REFLEX MICROSCOPIC
Bilirubin Urine: NEGATIVE
GLUCOSE, UA: 250 mg/dL — AB
HGB URINE DIPSTICK: NEGATIVE
KETONES UR: NEGATIVE mg/dL
LEUKOCYTES UA: NEGATIVE
Nitrite: NEGATIVE
PROTEIN: NEGATIVE mg/dL
Specific Gravity, Urine: 1.01 (ref 1.005–1.030)
pH: 7 (ref 5.0–8.0)

## 2016-06-10 LAB — CULTURE, OB URINE

## 2016-06-10 NOTE — MAU Note (Signed)
Was seen here few days ago with ctxs. Started again about 1800 tonight. Denies vag bleeding or LOF.

## 2016-06-11 DIAGNOSIS — O4703 False labor before 37 completed weeks of gestation, third trimester: Secondary | ICD-10-CM

## 2016-06-11 DIAGNOSIS — Z3A34 34 weeks gestation of pregnancy: Secondary | ICD-10-CM | POA: Diagnosis not present

## 2016-06-11 NOTE — Discharge Instructions (Signed)
Reasons to return to MAU:  1.  Contractions are  5 minutes apart or less, each last 1 minute, these have been going on for 1-2 hours, and you cannot walk or talk during them 2.  You have a large gush of fluid, or a trickle of fluid that will not stop and you have to wear a pad 3.  You have bleeding that is bright red, heavier than spotting--like menstrual bleeding (spotting can be normal in early labor or after a check of your cervix) 4.  You do not feel the baby moving like he/she normally does   Back Pain in Pregnancy Back pain during pregnancy is common. It happens in about half of all pregnancies. It is important for you and your baby that you remain active during your pregnancy.If you feel that back pain is not allowing you to remain active or sleep well, it is time to see your caregiver. Back pain may be caused by several factors related to changes during your pregnancy.Fortunately, unless you had trouble with your back before your pregnancy, the pain is likely to get better after you deliver. Low back pain usually occurs between the fifth and seventh months of pregnancy. It can, however, happen in the first couple months. Factors that increase the risk of back problems include:   Previous back problems.  Injury to your back.  Having twins or multiple births.  A chronic cough.  Stress.  Job-related repetitive motions.  Muscle or spinal disease in the back.  Family history of back problems, ruptured (herniated) discs, or osteoporosis.  Depression, anxiety, and panic attacks. CAUSES   When you are pregnant, your body produces a hormone called relaxin. This hormonemakes the ligaments connecting the low back and pubic bones more flexible. This flexibility allows the baby to be delivered more easily. When your ligaments are loose, your muscles need to work harder to support your back. Soreness in your back can come from tired muscles. Soreness can also come from back tissues that  are irritated since they are receiving less support.  As the baby grows, it puts pressure on the nerves and blood vessels in your pelvis. This can cause back pain.  As the baby grows and gets heavier during pregnancy, the uterus pushes the stomach muscles forward and changes your center of gravity. This makes your back muscles work harder to maintain good posture. SYMPTOMS  Lumbar pain during pregnancy Lumbar pain during pregnancy usually occurs at or above the waist in the center of the back. There may be pain and numbness that radiates into your leg or foot. This is similar to low back pain experienced by non-pregnant women. It usually increases with sitting for long periods of time, standing, or repetitive lifting. Tenderness may also be present in the muscles along your upper back. Posterior pelvic pain during pregnancy Pain in the back of the pelvis is more common than lumbar pain in pregnancy. It is a deep pain felt in your side at the waistline, or across the tailbone (sacrum), or in both places. You may have pain on one or both sides. This pain can also go into the buttocks and backs of the upper thighs. Pubic and groin pain may also be present. The pain does not quickly resolve with rest, and morning stiffness may also be present. Pelvic pain during pregnancy can be brought on by most activities. A high level of fitness before and during pregnancy may or may not prevent this problem. Labor pain is usually  1 to 2 minutes apart, lasts for about 1 minute, and involves a bearing down feeling or pressure in your pelvis. However, if you are at term with the pregnancy, constant low back pain can be the beginning of early labor, and you should be aware of this. DIAGNOSIS  X-rays of the back should not be done during the first 12 to 14 weeks of the pregnancy and only when absolutely necessary during the rest of the pregnancy. MRIs do not give off radiation and are safe during pregnancy. MRIs also should  only be done when absolutely necessary. HOME CARE INSTRUCTIONS  Exercise as directed by your caregiver. Exercise is the most effective way to prevent or manage back pain. If you have a back problem, it is especially important to avoid sports that require sudden body movements. Swimming and walking are great activities.  Do not stand in one place for long periods of time.  Do not wear high heels.  Sit in chairs with good posture. Use a pillow on your lower back if necessary. Make sure your head rests over your shoulders and is not hanging forward.  Try sleeping on your side, preferably the left side, with a pillow or two between your legs. If you are sore after a night's rest, your bedmay betoo soft.Try placing a board between your mattress and box spring.  Listen to your body when lifting.If you are experiencing pain, ask for help or try bending yourknees more so you can use your leg muscles rather than your back muscles. Squat down when picking up something from the floor. Do not bend over.  Eat a healthy diet. Try to gain weight within your caregiver's recommendations.  Use heat or cold packs 3 to 4 times a day for 15 minutes to help with the pain.  Only take over-the-counter or prescription medicines for pain, discomfort, or fever as directed by your caregiver. Sudden (acute) back pain  Use bed rest for only the most extreme, acute episodes of back pain. Prolonged bed rest over 48 hours will aggravate your condition.  Ice is very effective for acute conditions.  Put ice in a plastic bag.  Place a towel between your skin and the bag.  Leave the ice on for 10 to 20 minutes every 2 hours, or as needed.  Using heat packs for 30 minutes prior to activities is also helpful. Continued back pain See your caregiver if you have continued problems. Your caregiver can help or refer you for appropriate physical therapy. With conditioning, most back problems can be avoided. Sometimes, a  more serious issue may be the cause of back pain. You should be seen right away if new problems seem to be developing. Your caregiver may recommend:  A maternity girdle.  An elastic sling.  A back brace.  A massage therapist or acupuncture. SEEK MEDICAL CARE IF:   You are not able to do most of your daily activities, even when taking the pain medicine you were given.  You need a referral to a physical therapist or chiropractor.  You want to try acupuncture. SEEK IMMEDIATE MEDICAL CARE IF:  You develop numbness, tingling, weakness, or problems with the use of your arms or legs.  You develop severe back pain that is no longer relieved with medicines.  You have a sudden change in bowel or bladder control.  You have increasing pain in other areas of the body.  You develop shortness of breath, dizziness, or fainting.  You develop nausea, vomiting, or  sweating.  You have back pain which is similar to labor pains.  You have back pain along with your water breaking or vaginal bleeding.  You have back pain or numbness that travels down your leg.  Your back pain developed after you fell.  You develop pain on one side of your back. You may have a kidney stone.  You see blood in your urine. You may have a bladder infection or kidney stone.  You have back pain with blisters. You may have shingles. Back pain is fairly common during pregnancy but should not be accepted as just part of the process. Back pain should always be treated as soon as possible. This will make your pregnancy as pleasant as possible.   This information is not intended to replace advice given to you by your health care provider. Make sure you discuss any questions you have with your health care provider.   Document Released: 11/22/2005 Document Revised: 11/06/2011 Document Reviewed: 01/03/2011 Elsevier Interactive Patient Education [employer]2016 [employer]

## 2016-06-11 NOTE — Progress Notes (Signed)
FHT from late last pm reviewed.  Reactive NST, irreg ctx.

## 2016-07-10 ENCOUNTER — Inpatient Hospital Stay (HOSPITAL_COMMUNITY)
Admission: AD | Admit: 2016-07-10 | Discharge: 2016-07-10 | Disposition: A | Discharge status: Home / Self Care | Payer: BLUE CROSS/BLUE SHIELD | Source: Ambulatory Visit | Attending: Obstetrics and Gynecology | Admitting: Obstetrics and Gynecology

## 2016-07-10 ENCOUNTER — Encounter (HOSPITAL_COMMUNITY): Payer: Self-pay | Admitting: *Deleted

## 2016-07-10 ENCOUNTER — Inpatient Hospital Stay (HOSPITAL_COMMUNITY): Payer: BLUE CROSS/BLUE SHIELD

## 2016-07-10 DIAGNOSIS — Z3A38 38 weeks gestation of pregnancy: Secondary | ICD-10-CM | POA: Diagnosis not present

## 2016-07-10 DIAGNOSIS — O368130 Decreased fetal movements, third trimester, not applicable or unspecified: Secondary | ICD-10-CM | POA: Insufficient documentation

## 2016-07-10 NOTE — MAU Note (Signed)
Pt reports having cramping and ctx on and off since this morning. Reports some clear discharge no bleeding. Fetal movement a little less than usual but has picked up in the past 2 hours.

## 2016-07-13 ENCOUNTER — Inpatient Hospital Stay (HOSPITAL_COMMUNITY)
Admission: AD | Admit: 2016-07-13 | Discharge: 2016-07-17 | DRG: 765 | Disposition: A | Discharge status: Home / Self Care | Payer: BLUE CROSS/BLUE SHIELD | Source: Ambulatory Visit | Attending: Obstetrics and Gynecology | Admitting: Obstetrics and Gynecology

## 2016-07-13 ENCOUNTER — Encounter (HOSPITAL_COMMUNITY): Payer: Self-pay | Admitting: *Deleted

## 2016-07-13 DIAGNOSIS — O411230 Chorioamnionitis, third trimester, not applicable or unspecified: Secondary | ICD-10-CM | POA: Diagnosis present

## 2016-07-13 DIAGNOSIS — O4212 Full-term premature rupture of membranes, onset of labor more than 24 hours following rupture: Secondary | ICD-10-CM | POA: Diagnosis present

## 2016-07-13 DIAGNOSIS — Z3A38 38 weeks gestation of pregnancy: Secondary | ICD-10-CM | POA: Diagnosis not present

## 2016-07-13 DIAGNOSIS — O4292 Full-term premature rupture of membranes, unspecified as to length of time between rupture and onset of labor: Secondary | ICD-10-CM | POA: Diagnosis present

## 2016-07-13 LAB — CBC
HCT: 33.7 % — ABNORMAL LOW (ref 36.0–46.0)
Hemoglobin: 10.6 g/dL — ABNORMAL LOW (ref 12.0–15.0)
MCH: 23.9 pg — AB (ref 26.0–34.0)
MCHC: 31.5 g/dL (ref 30.0–36.0)
MCV: 76.1 fL — ABNORMAL LOW (ref 78.0–100.0)
PLATELETS: 189 10*3/uL (ref 150–400)
RBC: 4.43 MIL/uL (ref 3.87–5.11)
RDW: 16.1 % — AB (ref 11.5–15.5)
WBC: 14.5 10*3/uL — ABNORMAL HIGH (ref 4.0–10.5)

## 2016-07-13 LAB — TYPE AND SCREEN
ABO/RH(D): O POS
Antibody Screen: NEGATIVE

## 2016-07-13 LAB — ABO/RH: ABO/RH(D): O POS

## 2016-07-13 LAB — OB RESULTS CONSOLE GBS: GBS: NEGATIVE

## 2016-07-13 LAB — AMNISURE RUPTURE OF MEMBRANE (ROM) NOT AT ARMC: Amnisure ROM: POSITIVE

## 2016-07-13 LAB — RPR: RPR: NONREACTIVE

## 2016-07-13 MED ORDER — LIDOCAINE HCL (PF) 1 % IJ SOLN: 30.0000 mL | INTRAMUSCULAR | Status: DC | PRN

## 2016-07-13 MED ORDER — PHENYLEPHRINE 40 MCG/ML (10ML) SYRINGE FOR IV PUSH (FOR BLOOD PRESSURE SUPPORT): 80.0000 ug | PREFILLED_SYRINGE | INTRAVENOUS | Status: DC | PRN

## 2016-07-13 MED ORDER — OXYCODONE-ACETAMINOPHEN 5-325 MG PO TABS: 2.0000 | ORAL_TABLET | ORAL | Status: DC | PRN

## 2016-07-13 MED ORDER — OXYCODONE-ACETAMINOPHEN 5-325 MG PO TABS: 1.0000 | ORAL_TABLET | ORAL | Status: DC | PRN

## 2016-07-13 MED ORDER — OXYTOCIN BOLUS FROM INFUSION: 500.0000 mL | Freq: Once | INTRAVENOUS | Status: DC

## 2016-07-13 MED ORDER — ONDANSETRON HCL 4 MG/2ML IJ SOLN
4.0000 mg | Freq: Four times a day (QID) | INTRAMUSCULAR | Status: DC | PRN
  Administered 2016-07-14: 4 mg via INTRAVENOUS
  Filled 2016-07-13: qty 2

## 2016-07-13 MED ORDER — BUTORPHANOL TARTRATE 1 MG/ML IJ SOLN
INTRAMUSCULAR | Status: AC
  Administered 2016-07-13: 1 mg via INTRAVENOUS
  Filled 2016-07-13: qty 1

## 2016-07-13 MED ORDER — EPHEDRINE 5 MG/ML INJ: 10.0000 mg | INTRAVENOUS | Status: DC | PRN

## 2016-07-13 MED ORDER — DIPHENHYDRAMINE HCL 50 MG/ML IJ SOLN: 12.5000 mg | INTRAMUSCULAR | Status: DC | PRN

## 2016-07-13 MED ORDER — TERBUTALINE SULFATE 1 MG/ML IJ SOLN
0.2500 mg | Freq: Once | INTRAMUSCULAR | Status: DC | PRN
Start: 2016-07-13 — End: 2016-07-14

## 2016-07-13 MED ORDER — OXYTOCIN 40 UNITS IN LACTATED RINGERS INFUSION - SIMPLE MED
1.0000 m[IU]/min | INTRAVENOUS | Status: DC
  Administered 2016-07-13 (×2): 2 m[IU]/min via INTRAVENOUS
  Administered 2016-07-14: 20 m[IU]/min via INTRAVENOUS
  Administered 2016-07-14: 10 m[IU]/min via INTRAVENOUS
  Administered 2016-07-14: 18 m[IU]/min via INTRAVENOUS
  Administered 2016-07-14: 12 m[IU]/min via INTRAVENOUS
  Administered 2016-07-14: 14 m[IU]/min via INTRAVENOUS
  Administered 2016-07-14: 16 m[IU]/min via INTRAVENOUS
  Filled 2016-07-13 (×2): qty 1000

## 2016-07-13 MED ORDER — FENTANYL 2.5 MCG/ML BUPIVACAINE 1/10 % EPIDURAL INFUSION (WH - ANES)
14.0000 mL/h | INTRAMUSCULAR | Status: DC | PRN
  Administered 2016-07-14 (×3): 14 mL/h via EPIDURAL
  Filled 2016-07-13 (×3): qty 100

## 2016-07-13 MED ORDER — LACTATED RINGERS IV SOLN: 500.0000 mL | INTRAVENOUS | Status: DC | PRN

## 2016-07-13 MED ORDER — BUTORPHANOL TARTRATE 1 MG/ML IJ SOLN
1.0000 mg | INTRAMUSCULAR | Status: DC | PRN
  Administered 2016-07-13 – 2016-07-14 (×2): 1 mg via INTRAVENOUS
  Filled 2016-07-13: qty 1

## 2016-07-13 MED ORDER — SOD CITRATE-CITRIC ACID 500-334 MG/5ML PO SOLN
30.0000 mL | ORAL | Status: DC | PRN
  Administered 2016-07-14: 30 mL via ORAL
  Filled 2016-07-13: qty 15

## 2016-07-13 MED ORDER — ACETAMINOPHEN 325 MG PO TABS
650.0000 mg | ORAL_TABLET | ORAL | Status: DC | PRN
  Administered 2016-07-14: 650 mg via ORAL
  Filled 2016-07-13: qty 2

## 2016-07-13 MED ORDER — PHENYLEPHRINE 40 MCG/ML (10ML) SYRINGE FOR IV PUSH (FOR BLOOD PRESSURE SUPPORT)
80.0000 ug | PREFILLED_SYRINGE | INTRAVENOUS | Status: DC | PRN
  Administered 2016-07-14: 80 ug via INTRAVENOUS
  Filled 2016-07-13: qty 10

## 2016-07-13 MED ORDER — OXYTOCIN 40 UNITS IN LACTATED RINGERS INFUSION - SIMPLE MED: 2.5000 [IU]/h | INTRAVENOUS | Status: DC

## 2016-07-13 MED ORDER — LACTATED RINGERS IV SOLN: 500.0000 mL | Freq: Once | INTRAVENOUS | Status: DC

## 2016-07-13 MED ORDER — FLEET ENEMA 7-19 GM/118ML RE ENEM: 1.0000 | ENEMA | RECTAL | Status: DC | PRN

## 2016-07-13 MED ORDER — LACTATED RINGERS IV SOLN
INTRAVENOUS | Status: DC
  Administered 2016-07-13 – 2016-07-14 (×5): via INTRAVENOUS

## 2016-07-13 NOTE — Progress Notes (Signed)
Feeling ctx, using nitrous Afeb, VSS FHT- 140-150, mod variability, no significant decels, Cat I, ctx q 2-3 min on 14 mu/min pitocin VE-2/70/-3, vtx Continue pitocin, monitor progress

## 2016-07-13 NOTE — Progress Notes (Signed)
Still feeling ctx and using nitrous Afeb, VSS FHT- 140-150, mod variability, + accels, + scalp stim, Cat I, ctx q 2-3 min on 2220mu/min pitocin VE-3/70/-2, vtx, IUPC placed Continue pitocin and monitor progress, most likely still in latent labor

## 2016-07-13 NOTE — Progress Notes (Signed)
Provider updated on sve, strip reviewed, orders to turn the pit off for an hour and restart at 2 going up by 2

## 2016-07-13 NOTE — MAU Note (Signed)
Pt presents complaining of leaking of fluid that started at midnight. Is continuing to leak fluid now. Denies pain. Denies vaginal bleeding. Reports good fetal movement. GBS- 1cm in office Monday.

## 2016-07-13 NOTE — H&P (Addendum)
Feliz BeamJasmine Lattner is a 25 y.o. female G1P0 at 38+ who presents with ROM, confirmed in MAU.  Clear fluid.  Relatively uncomplicated PNC.  GBBS  Negative.  Declines genetic screening.    OB History    Gravida Para Term Preterm AB Living   1             SAB TAB Ectopic Multiple Live Births                G1 present No abn pap, last 2015 +GC   Past Medical History:  Diagnosis Date  . Hx of gonorrhea 2012  . Medical history non-contributory    Past Surgical History:  Procedure Laterality Date  . NO PAST SURGERIES     Family History: family history includes Heart disease in her maternal grandfather and maternal grandmother. Social History:  reports that she has never smoked. She has never used smokeless tobacco. She reports that she does not drink alcohol or use drugs.   Meds PNV All NKDA     Maternal Diabetes: No Genetic Screening: Declined Maternal Ultrasounds/Referrals: Normal Fetal Ultrasounds or other Referrals:  None Maternal Substance Abuse:  No Significant Maternal Medications:  None Significant Maternal Lab Results:  Lab values include: Group B Strep negative Other Comments:  None  Review of Systems  Constitutional: Negative.   HENT: Negative.   Eyes: Negative.   Respiratory: Negative.   Cardiovascular: Negative.   Gastrointestinal: Negative.   Genitourinary: Negative.   Musculoskeletal: Positive for back pain.  Skin: Negative.   Neurological: Negative.   Psychiatric/Behavioral: Negative.    Maternal Medical History:  Reason for admission: Rupture of membranes.   Fetal activity: Perceived fetal activity is normal.    Prenatal Complications - Diabetes: none.    Dilation: 1.5 Effacement (%): 40 Station: -3 Exam by:: K. WeissRN Blood pressure 135/88, pulse 118, temperature 98 F (36.7 C), temperature source Oral, resp. rate 18. Maternal Exam:  Abdomen: Patient reports no abdominal tenderness. Fundal height is appropriate for gestation.   Estimated  fetal weight is 7-8#.   Fetal presentation: vertex  Introitus: Normal vulva. Normal vagina.    Physical Exam  Constitutional: She is oriented to person, place, and time. She appears well-developed and well-nourished.  HENT:  Head: Normocephalic and atraumatic.  Cardiovascular: Normal rate and regular rhythm.   Respiratory: Effort normal and breath sounds normal. No respiratory distress. She has no wheezes.  GI: Soft. Bowel sounds are normal. She exhibits no distension. There is no tenderness.  Musculoskeletal: Normal range of motion.  Neurological: She is alert and oriented to person, place, and time.  Skin: Skin is warm and dry.  Psychiatric: She has a normal mood and affect. Her behavior is normal.    Prenatal labs: ABO, Rh:  O+ Antibody:  neg Rubella:  immune RPR:   NR HBsAg:   neg HIV:   neg GBS:   neg  Hgb 13.9/Plt 289/Ur Cx neg/Pap WNL/Varicella immune/GC neg/Chl neg/nl Hgb electro/glucola 118  US nl anat, post plac, female  Assessment/Plan: 25yo G1 at 38+ with ROM Epidural, nitrous or stadol prn pain Augment with pitocin Expect SVD  Bovard-Stuckert, Naisha Wisdom 07/13/2016, 2:35 AM

## 2016-07-13 NOTE — Progress Notes (Signed)
Feeling some ctx, smiling Afeb, VSS FHT- 140-150, mod variability, no accels, maybe rare variable decel, Cat II occ ctx Continue pitocin and monitor FHT and progress

## 2016-07-13 NOTE — Anesthesia Pain Management Evaluation Note (Signed)
  CRNA Pain Management Visit Note  Patient: Crystal BeamJasmine Fickel, 25 y.o., female  "[facility] We have an anesthesia team available at all times to provide care throughout the hospital, including epidural management and anesthesia for C-section. I don't know your plan for the delivery whether it a natural birth, water birth, IV sedation, nitrous supplementation, doula or epidural, but we want to meet your pain goals."   1.Was your pain managed to your expectations on prior hospitalizations?   No   2.What is your expectation for pain management during this hospitalization?     IV pain meds and Nitrous Oxide  3.How can we help you reach that goal? Be available if pt decides she wants an epidural;discussed as an option  Record the patient's initial score and the patient's pain goal.   Pain: 3  Pain Goal: 8 [facility] [address] wants you to be able to say your pain was always managed very well.  Edison PaceWILKERSON,Blima Jaimes 07/13/2016

## 2016-07-14 ENCOUNTER — Inpatient Hospital Stay (HOSPITAL_COMMUNITY): Payer: BLUE CROSS/BLUE SHIELD | Admitting: Anesthesiology

## 2016-07-14 ENCOUNTER — Encounter (HOSPITAL_COMMUNITY)
Admission: AD | Disposition: A | Discharge status: Home / Self Care | Payer: Self-pay | Source: Ambulatory Visit | Attending: Obstetrics and Gynecology

## 2016-07-14 ENCOUNTER — Encounter (HOSPITAL_COMMUNITY): Payer: Self-pay | Admitting: Neonatal-Perinatal Medicine

## 2016-07-14 SURGERY — Surgical Case
Anesthesia: Epidural

## 2016-07-14 MED ORDER — MORPHINE SULFATE (PF) 0.5 MG/ML IJ SOLN
INTRAMUSCULAR | Status: DC | PRN
  Administered 2016-07-14: 3 mg via EPIDURAL
  Administered 2016-07-14: 2 mg via INTRAVENOUS

## 2016-07-14 MED ORDER — OXYTOCIN 10 UNIT/ML IJ SOLN
INTRAMUSCULAR | Status: AC
  Filled 2016-07-14: qty 4

## 2016-07-14 MED ORDER — COCONUT OIL OIL: 1.0000 "application " | TOPICAL_OIL | Status: DC | PRN

## 2016-07-14 MED ORDER — ACETAMINOPHEN 325 MG PO TABS: 650.0000 mg | ORAL_TABLET | ORAL | Status: DC | PRN

## 2016-07-14 MED ORDER — IBUPROFEN 600 MG PO TABS
600.0000 mg | ORAL_TABLET | Freq: Four times a day (QID) | ORAL | Status: DC
  Administered 2016-07-15 – 2016-07-17 (×11): 600 mg via ORAL
  Filled 2016-07-14 (×11): qty 1

## 2016-07-14 MED ORDER — KETOROLAC TROMETHAMINE 30 MG/ML IJ SOLN: 30.0000 mg | Freq: Four times a day (QID) | INTRAMUSCULAR | Status: DC | PRN

## 2016-07-14 MED ORDER — MEPERIDINE HCL 25 MG/ML IJ SOLN: 6.2500 mg | INTRAMUSCULAR | Status: DC | PRN

## 2016-07-14 MED ORDER — ONDANSETRON HCL 4 MG/2ML IJ SOLN
INTRAMUSCULAR | Status: AC
  Filled 2016-07-14: qty 2

## 2016-07-14 MED ORDER — DIPHENHYDRAMINE HCL 25 MG PO CAPS: 25.0000 mg | ORAL_CAPSULE | Freq: Four times a day (QID) | ORAL | Status: DC | PRN

## 2016-07-14 MED ORDER — OXYCODONE HCL 5 MG PO TABS
5.0000 mg | ORAL_TABLET | ORAL | Status: DC | PRN
  Administered 2016-07-16 – 2016-07-17 (×2): 5 mg via ORAL
  Filled 2016-07-14 (×2): qty 1

## 2016-07-14 MED ORDER — OXYCODONE HCL 5 MG PO TABS
10.0000 mg | ORAL_TABLET | ORAL | Status: DC | PRN
  Administered 2016-07-16: 10 mg via ORAL
  Filled 2016-07-14: qty 2

## 2016-07-14 MED ORDER — OXYTOCIN 40 UNITS IN LACTATED RINGERS INFUSION - SIMPLE MED: 2.5000 [IU]/h | INTRAVENOUS | Status: AC

## 2016-07-14 MED ORDER — ONDANSETRON HCL 4 MG/2ML IJ SOLN
INTRAMUSCULAR | Status: DC | PRN
  Administered 2016-07-14: 4 mg via INTRAVENOUS

## 2016-07-14 MED ORDER — SIMETHICONE 80 MG PO CHEW: 80.0000 mg | CHEWABLE_TABLET | ORAL | Status: DC | PRN

## 2016-07-14 MED ORDER — SCOPOLAMINE 1 MG/3DAYS TD PT72
MEDICATED_PATCH | TRANSDERMAL | Status: DC | PRN
  Administered 2016-07-14: 1 via TRANSDERMAL

## 2016-07-14 MED ORDER — DIBUCAINE 1 % RE OINT: 1.0000 "application " | TOPICAL_OINTMENT | RECTAL | Status: DC | PRN

## 2016-07-14 MED ORDER — ACETAMINOPHEN 325 MG PO TABS
ORAL_TABLET | ORAL | Status: AC
  Filled 2016-07-14: qty 2

## 2016-07-14 MED ORDER — METOCLOPRAMIDE HCL 5 MG/ML IJ SOLN
INTRAMUSCULAR | Status: AC
  Filled 2016-07-14: qty 2

## 2016-07-14 MED ORDER — GENTAMICIN SULFATE 40 MG/ML IJ SOLN
1.5000 mg/kg | Freq: Three times a day (TID) | INTRAMUSCULAR | Status: DC
  Administered 2016-07-14 – 2016-07-15 (×4): 140 mg via INTRAVENOUS
  Filled 2016-07-14 (×5): qty 3.5

## 2016-07-14 MED ORDER — MISOPROSTOL 200 MCG PO TABS
ORAL_TABLET | ORAL | Status: AC
Start: 2016-07-14 — End: 2016-07-14
  Filled 2016-07-14: qty 4

## 2016-07-14 MED ORDER — MORPHINE SULFATE (PF) 0.5 MG/ML IJ SOLN
INTRAMUSCULAR | Status: AC
  Filled 2016-07-14: qty 10

## 2016-07-14 MED ORDER — LIDOCAINE-EPINEPHRINE (PF) 2 %-1:200000 IJ SOLN
INTRAMUSCULAR | Status: DC | PRN
  Administered 2016-07-14: 10 mL via EPIDURAL
  Administered 2016-07-14: 5 mL via EPIDURAL

## 2016-07-14 MED ORDER — CEFAZOLIN SODIUM-DEXTROSE 2-4 GM/100ML-% IV SOLN
2.0000 g | Freq: Once | INTRAVENOUS | Status: AC
  Administered 2016-07-14: 2 g via INTRAVENOUS
  Filled 2016-07-14: qty 100

## 2016-07-14 MED ORDER — LACTATED RINGERS IV SOLN
INTRAVENOUS | Status: DC | PRN
  Administered 2016-07-14 (×4): via INTRAVENOUS

## 2016-07-14 MED ORDER — LACTATED RINGERS IV SOLN
INTRAVENOUS | Status: DC
Start: 2016-07-14 — End: 2016-07-14
  Administered 2016-07-14: 13:00:00 via INTRAUTERINE

## 2016-07-14 MED ORDER — MENTHOL 3 MG MT LOZG: 1.0000 | LOZENGE | OROMUCOSAL | Status: DC | PRN

## 2016-07-14 MED ORDER — SODIUM CHLORIDE 0.9 % IR SOLN
Status: DC | PRN
  Administered 2016-07-14: 1

## 2016-07-14 MED ORDER — METOCLOPRAMIDE HCL 5 MG/ML IJ SOLN
INTRAMUSCULAR | Status: DC | PRN
  Administered 2016-07-14: 10 mg via INTRAVENOUS

## 2016-07-14 MED ORDER — OXYTOCIN 10 UNIT/ML IJ SOLN
INTRAMUSCULAR | Status: DC | PRN
  Administered 2016-07-14: 40 [IU] via INTRAVENOUS

## 2016-07-14 MED ORDER — PHENYLEPHRINE HCL 10 MG/ML IJ SOLN
INTRAMUSCULAR | Status: DC | PRN
  Administered 2016-07-14 (×2): 80 ug via INTRAVENOUS

## 2016-07-14 MED ORDER — ZOLPIDEM TARTRATE 5 MG PO TABS
5.0000 mg | ORAL_TABLET | Freq: Every evening | ORAL | Status: DC | PRN
Start: 2016-07-14 — End: 2016-07-17

## 2016-07-14 MED ORDER — SODIUM BICARBONATE 8.4 % IV SOLN
INTRAVENOUS | Status: AC
  Filled 2016-07-14: qty 50

## 2016-07-14 MED ORDER — SCOPOLAMINE 1 MG/3DAYS TD PT72
MEDICATED_PATCH | TRANSDERMAL | Status: AC
  Filled 2016-07-14: qty 1

## 2016-07-14 MED ORDER — TETANUS-DIPHTH-ACELL PERTUSSIS 5-2.5-18.5 LF-MCG/0.5 IM SUSP: 0.5000 mL | Freq: Once | INTRAMUSCULAR | Status: DC

## 2016-07-14 MED ORDER — SIMETHICONE 80 MG PO CHEW
80.0000 mg | CHEWABLE_TABLET | ORAL | Status: DC
  Administered 2016-07-15 (×2): 80 mg via ORAL
  Filled 2016-07-14 (×2): qty 1

## 2016-07-14 MED ORDER — PRENATAL MULTIVITAMIN CH
1.0000 | ORAL_TABLET | Freq: Every day | ORAL | Status: DC
  Administered 2016-07-15 – 2016-07-17 (×3): 1 via ORAL
  Filled 2016-07-14 (×3): qty 1

## 2016-07-14 MED ORDER — ACETAMINOPHEN 325 MG PO TABS
650.0000 mg | ORAL_TABLET | Freq: Four times a day (QID) | ORAL | Status: DC | PRN
  Administered 2016-07-14: 650 mg via ORAL

## 2016-07-14 MED ORDER — DEXAMETHASONE SODIUM PHOSPHATE 10 MG/ML IJ SOLN
INTRAMUSCULAR | Status: DC | PRN
  Administered 2016-07-14: 4 mg via INTRAVENOUS

## 2016-07-14 MED ORDER — PHENYLEPHRINE 40 MCG/ML (10ML) SYRINGE FOR IV PUSH (FOR BLOOD PRESSURE SUPPORT)
PREFILLED_SYRINGE | INTRAVENOUS | Status: AC
  Filled 2016-07-14: qty 10

## 2016-07-14 MED ORDER — CEFAZOLIN SODIUM-DEXTROSE 2-3 GM-% IV SOLR: INTRAVENOUS | Status: DC | PRN

## 2016-07-14 MED ORDER — LIDOCAINE HCL (PF) 1 % IJ SOLN
INTRAMUSCULAR | Status: DC | PRN
  Administered 2016-07-14: 6 mL via EPIDURAL
  Administered 2016-07-14: 4 mL

## 2016-07-14 MED ORDER — LACTATED RINGERS IV SOLN
INTRAVENOUS | Status: DC | PRN
  Administered 2016-07-14: 20:00:00 via INTRAVENOUS

## 2016-07-14 MED ORDER — FERROUS SULFATE 325 (65 FE) MG PO TABS
325.0000 mg | ORAL_TABLET | Freq: Two times a day (BID) | ORAL | Status: DC
  Administered 2016-07-15 – 2016-07-17 (×5): 325 mg via ORAL
  Filled 2016-07-14 (×5): qty 1

## 2016-07-14 MED ORDER — LACTATED RINGERS IV SOLN
INTRAVENOUS | Status: DC
  Administered 2016-07-15 (×2): 1 mL via INTRAVENOUS

## 2016-07-14 MED ORDER — KETOROLAC TROMETHAMINE 30 MG/ML IJ SOLN
INTRAMUSCULAR | Status: AC
  Filled 2016-07-14: qty 2

## 2016-07-14 MED ORDER — DEXAMETHASONE SODIUM PHOSPHATE 4 MG/ML IJ SOLN
INTRAMUSCULAR | Status: AC
  Filled 2016-07-14: qty 1

## 2016-07-14 MED ORDER — FENTANYL CITRATE (PF) 100 MCG/2ML IJ SOLN: 25.0000 ug | INTRAMUSCULAR | Status: DC | PRN

## 2016-07-14 MED ORDER — WITCH HAZEL-GLYCERIN EX PADS: 1.0000 "application " | MEDICATED_PAD | CUTANEOUS | Status: DC | PRN

## 2016-07-14 MED ORDER — SENNOSIDES-DOCUSATE SODIUM 8.6-50 MG PO TABS
2.0000 | ORAL_TABLET | ORAL | Status: DC
  Administered 2016-07-15 (×2): 2 via ORAL
  Filled 2016-07-14 (×2): qty 2

## 2016-07-14 MED ORDER — SODIUM CHLORIDE 0.9 % IV SOLN
2.0000 g | Freq: Four times a day (QID) | INTRAVENOUS | Status: DC
  Administered 2016-07-14 – 2016-07-15 (×5): 2 g via INTRAVENOUS
  Filled 2016-07-14 (×7): qty 2000

## 2016-07-14 MED ORDER — SIMETHICONE 80 MG PO CHEW
80.0000 mg | CHEWABLE_TABLET | Freq: Three times a day (TID) | ORAL | Status: DC
  Administered 2016-07-15 – 2016-07-17 (×7): 80 mg via ORAL
  Filled 2016-07-14 (×7): qty 1

## 2016-07-14 MED ORDER — MISOPROSTOL 200 MCG PO TABS: 800.0000 ug | ORAL_TABLET | Freq: Once | ORAL | Status: DC

## 2016-07-14 MED ORDER — LIDOCAINE-EPINEPHRINE (PF) 2 %-1:200000 IJ SOLN
INTRAMUSCULAR | Status: AC
  Filled 2016-07-14: qty 20

## 2016-07-14 SURGICAL SUPPLY — 37 items
BENZOIN TINCTURE PRP APPL 2/3 (GAUZE/BANDAGES/DRESSINGS) ×3 IMPLANT
CHLORAPREP W/TINT 26ML (MISCELLANEOUS) ×3 IMPLANT
CLAMP CORD UMBIL (MISCELLANEOUS) IMPLANT
CLOSURE WOUND 1/2 X4 (GAUZE/BANDAGES/DRESSINGS) ×1
CLOTH BEACON ORANGE TIMEOUT ST (SAFETY) ×3 IMPLANT
DRAPE C SECTION CLR SCREEN (DRAPES) ×3 IMPLANT
DRSG OPSITE POSTOP 4X10 (GAUZE/BANDAGES/DRESSINGS) ×3 IMPLANT
ELECT REM PT RETURN 9FT ADLT (ELECTROSURGICAL) ×3
ELECTRODE REM PT RTRN 9FT ADLT (ELECTROSURGICAL) ×1 IMPLANT
EXTRACTOR VACUUM KIWI (MISCELLANEOUS) IMPLANT
GLOVE BIO SURGEON STRL SZ 6.5 (GLOVE) ×2 IMPLANT
GLOVE BIO SURGEONS STRL SZ 6.5 (GLOVE) ×1
GLOVE BIOGEL PI IND STRL 7.0 (GLOVE) ×1 IMPLANT
GLOVE BIOGEL PI INDICATOR 7.0 (GLOVE) ×2
GOWN STRL REUS W/TWL LRG LVL3 (GOWN DISPOSABLE) ×6 IMPLANT
KIT ABG SYR 3ML LUER SLIP (SYRINGE) IMPLANT
NEEDLE HYPO 25X5/8 SAFETYGLIDE (NEEDLE) IMPLANT
NS IRRIG 1000ML POUR BTL (IV SOLUTION) ×3 IMPLANT
PACK C SECTION WH (CUSTOM PROCEDURE TRAY) ×3 IMPLANT
PAD ABD 8X10 STRL (GAUZE/BANDAGES/DRESSINGS) ×3 IMPLANT
PAD OB MATERNITY 4.3X12.25 (PERSONAL CARE ITEMS) ×3 IMPLANT
PENCIL SMOKE EVAC W/HOLSTER (ELECTROSURGICAL) ×3 IMPLANT
RTRCTR C-SECT PINK 25CM LRG (MISCELLANEOUS) ×3 IMPLANT
SPONGE GAUZE 4X4 12PLY STER LF (GAUZE/BANDAGES/DRESSINGS) ×6 IMPLANT
STRIP CLOSURE SKIN 1/2X4 (GAUZE/BANDAGES/DRESSINGS) ×2 IMPLANT
SUT CHROMIC 1 CTX 36 (SUTURE) ×9 IMPLANT
SUT PLAIN 0 NONE (SUTURE) IMPLANT
SUT PLAIN 2 0 XLH (SUTURE) ×3 IMPLANT
SUT VIC AB 0 CT1 27 (SUTURE) ×4
SUT VIC AB 0 CT1 27XBRD ANBCTR (SUTURE) ×2 IMPLANT
SUT VIC AB 2-0 CT1 27 (SUTURE) ×2
SUT VIC AB 2-0 CT1 TAPERPNT 27 (SUTURE) ×1 IMPLANT
SUT VIC AB 3-0 CT1 27 (SUTURE)
SUT VIC AB 3-0 CT1 TAPERPNT 27 (SUTURE) IMPLANT
SUT VIC AB 4-0 KS 27 (SUTURE) ×3 IMPLANT
TOWEL OR 17X24 6PK STRL BLUE (TOWEL DISPOSABLE) ×3 IMPLANT
TRAY FOLEY CATH SILVER 14FR (SET/KITS/TRAYS/PACK) ×3 IMPLANT

## 2016-07-14 NOTE — Plan of Care (Signed)
Problem: Life Cycle: Goal: Ability to effectively push during vaginal delivery will improve Outcome: Not Applicable Date Met: 79/15/05 Cesarean delivery

## 2016-07-14 NOTE — Progress Notes (Signed)
Comfortable with epidural Afeb, VSS FHT- 140-150, mod variability, some late and some variable decels, + scalp stim, Cat II, ctx q 3-5 min VE-5/90/-1, IUPC reinserted Since my last exam, first IUPC came out and we turned off her pitocin for an hour and restarted.  She is now comfortable with epidural, has made some progress, hopefully getting into active labor.  Will continue pitocin, monitor FHT and progress.

## 2016-07-14 NOTE — Anesthesia Preprocedure Evaluation (Addendum)
Anesthesia Evaluation  Patient identified by MRN, date of birth, ID band Patient awake    Reviewed: Allergy & Precautions, H&P , NPO status , Patient's Chart, lab work & pertinent test results  History of Anesthesia Complications Negative for: history of anesthetic complications  Airway Mallampati: II  TM Distance: >3 FB Neck ROM: full    Dental  (+) Teeth Intact   Pulmonary neg pulmonary ROS,    breath sounds clear to auscultation       Cardiovascular negative cardio ROS   Rhythm:regular Rate:Normal     Neuro/Psych negative neurological ROS  negative psych ROS   GI/Hepatic negative GI ROS, Neg liver ROS,   Endo/Other  negative endocrine ROS  Renal/GU negative Renal ROS     Musculoskeletal   Abdominal   Peds  Hematology negative hematology ROS (+)   Anesthesia Other Findings       Reproductive/Obstetrics (+) Pregnancy                            Anesthesia Physical Anesthesia Plan  ASA: II  Anesthesia Plan: Epidural   Post-op Pain Management:    Induction:   Airway Management Planned: Nasal Cannula and Natural Airway  Additional Equipment: None  Intra-op Plan:   Post-operative Plan:   Informed Consent: I have reviewed the patients History and Physical, chart, labs and discussed the procedure including the risks, benefits and alternatives for the proposed anesthesia with the patient or authorized representative who has indicated his/her understanding and acceptance.   Dental Advisory Given  Plan Discussed with: CRNA and Surgeon  Anesthesia Plan Comments: (Labs checked- platelets confirmed with RN in room. Fetal heart tracing, per RN, reported to be stable enough for sitting procedure. Discussed epidural, and patient consents to the procedure:  included risk of possible headache,backache, failed block, allergic reaction, and nerve injury. This patient was asked if she  had any questions or concerns before the procedure started.)       Anesthesia Quick Evaluation

## 2016-07-14 NOTE — Op Note (Signed)
Operative Note    Preoperative Diagnosis: 1. iup at 38 5/7wks                                             2. Maternal temp                                             3. Remote from delivery                                             4. Prolonged ROM  Postoperative Diagnosis: same                                              5. Occiput posterior   Procedure: Primary low transverse c/s   Surgeon: Dr BaMindi Slickernga D.O.  Anesthesia: Epidural  Fluids: LR 4200ml EBL: 1500ml UOP:36900ml   Findings: Viable female infant in cephalic presentation, Apgars 10 and 10; weight pending Boggy uterus; grossly normal tubes and ovaries   Specimen: placenta to pathology   Procedure NoteProcedure note  Patient was taken to the operating room where epidural anesthesia was found to be adequate by Allis clamp test. She was prepped and draped in the normal sterile fashion in the dorsal supine position with a leftward tilt. An appropriate time out was performed. A Pfannenstiel skin incision was then made with the scalpel and carried through to the underlying layer of fascia by sharp dissection and Bovie cautery. The fascia was nicked in the midline and the incision was extended laterally with Mayo scissors. The inferior aspect of the incision was grasped Coker clamps and dissected off the underlying rectus muscles. In a similar fashion the superior aspect was dissected off the rectus muscles. Rectus muscles were separated in the midline and the peritoneal cavity entered bluntly. The peritoneal incision was then extended both superiorly and inferiorly with careful attention to avoid both bowel and bladder. The Alexis self-retaining wound retractor was then placed within the incision and the lower uterine segment exposed. The bladder flap was developed with Metzenbaum scissors and pushed away from the lower uterine segment. The lower uterine segment was then incised in a transverse fashion and the cavity itself entered  bluntly. The incision was extended bluntly. The infant's head was then lifted and delivered from the incision without difficulty. A loose loop of nuchal cord was reduced.  The remainder of the infant delivered and the nose and mouth bulb suctioned with the cord clamped and cut as well. The infant was handed off to the waiting pediatricians. The placenta was then spontaneously expressed from the uterus and the uterus cleared of all clots and debris with moist lap sponge. The uterine incision was then repaired in 2 layers the first layer was a running locked layer 1-0 chromic and the second an imbricating layer of the same suture. An 3cm extension in the left lower quadrant was also repaired with great hemostasis. The tubes and ovaries were inspected and the gutters cleared of all clots and debris. The uterine  incision was inspected and found to be hemostatic. All instruments and sponges as well as the Alexis retractor were then removed from the abdomen. The rectus muscles and peritoneum were then reapproximated with several interrupted mattress sutures of 2-0 Vicryl. The fascia was then closed with 0 Vicryl in a running fashion.The skin was closed with a subcuticular stitch of 4-0 Vicryl on a Keith needle and then reinforced with benzoin and Steri-Strips. At the conclusion of the procedure all instruments and sponge counts were correct. Patient was taken to the recovery room in good condition with her baby accompanying her skin to skin.

## 2016-07-14 NOTE — Progress Notes (Signed)
Patient ID: Crystal Church, female   DOB: September 05, 1990, 25 y.o.   MRN: [id number] Late entry  Pt 8-9cm dilated when checked at 5:00pm; -1 Temp of 100.3 noted Cat 1 strip  Discussed plan as follows with pt: Stop amnioinfusion; Add gentamycin Tylenol for temp Recheck in an hour and if sx worsening will have to consider alternate route for delivery to prevent risk of chorioamnionitis

## 2016-07-14 NOTE — Anesthesia Procedure Notes (Signed)

## 2016-07-14 NOTE — Progress Notes (Signed)
Patient ID: [name], female   DOB: May 29, 1991, 25 y.o.   MRN: [id number] Pt is still comfortable and denies any fever or chills  Temp 100.4 ( received tylenol an hour ago) EFM -155,minimal variab but responsive on scalp stim TOCO - contractions q 1-544mins SVE 9.5cm/100/-1  Plan: Attempted pushing to reduce remainder of cervix but unsuccessful    Discussed concerns with pt: maternal fever remote from delivery and cat 2 strip.     Offered c/s with r/b/a and future options of repeat c/s vs TOLAC discussed.     All questions answered     Pt agrees to primary c/s; consent signed     Staff notified     To OR when ready

## 2016-07-14 NOTE — Transfer of Care (Signed)
Immediate Anesthesia Transfer of Care Note  Patient: Crystal Church  Procedure(s) Performed: Procedure(s): CESAREAN SECTION (N/A)  Patient Location: PACU  Anesthesia Type:Epidural  Level of Consciousness: awake, alert  and oriented  Airway & Oxygen Therapy: Patient Spontanous Breathing  Post-op Assessment: Report given to RN and Post -op Vital signs reviewed and stable  Post vital signs: Reviewed and stable  Last Vitals:  Vitals:   07/14/16 1857 07/14/16 1901  BP:  135/89  Pulse:  (!) 119  Resp:  18  Temp: (!) 38 C     Last Pain:  Vitals:   07/14/16 1857  TempSrc: Axillary  PainSc:          Complications: No apparent anesthesia complications

## 2016-07-14 NOTE — Progress Notes (Signed)
FHR had repetitive late decels, resuscitative measures were taken, pitocin turned off.   An FSE was placed around 0436, [facility], one variable decel.  Will restart pitocin, monitor FHT and progress

## 2016-07-14 NOTE — Progress Notes (Signed)
Crystal Church is a 25 y.o. G1P0 at 6059w5d. Pt with no complaints at this time  Subjective:   Objective: BP 136/90   Pulse (!) 113   Temp 98.6 F (37 C)   Resp 20   Ht 5\' 4"  (1.626 m)   Wt 202 lb (91.6 kg)   SpO2 100%   BMI 34.67 kg/m  I/O last 3 completed shifts: In: -  Out: 450 [Urine:450] Total I/O In: -  Out: 500 [Urine:500]  FHT:  FHR: 145 bpm, variability: moderate,  accelerations:  Present,  decelerations:  Present variables UC:   regular, every 5 minutes SVE:   Dilation: 7.5 Effacement (%): 90 Station: 0 Exam by:: Katalyn Matin MD   Labs: Lab Results  Component Value Date   WBC 14.5 (H) 07/13/2016   HGB 10.6 (L) 07/13/2016   HCT 33.7 (L) 07/13/2016   MCV 76.1 (L) 07/13/2016   PLT 189 07/13/2016    Assessment / Plan: Induction of labor due to PROM,  progressing well on pitocin  Labor: progressing slowly; will perform amnioinfusion due to subtle variables to see if help to resolve; pitocin as per protocol Preeclampsia:  n/a Fetal Wellbeing:  Category II Pain Control:  Epidural I/D:  n/a Anticipated MOD:  NSVD  Crystal Church Crystal Church 07/14/2016, 1:12 PM

## 2016-07-14 NOTE — Progress Notes (Signed)
Patient ID: Crystal Church, female   DOB: [birth year], 25 y.o.   MRN: [id number] Pt comfortable with epidural. She has no complaints- denies any fever or chills. +Fms VSS  EFM- 145, now cat 1 TOCO - irreg contractions q 3-705mins inadequate mvus SVE - 7/90/-1; no molding; feels asynclitic but engaged  A/P: Prime at 6438 5/7wks with SROM x 30hours - afeb         Will start on iv antibx         Pit at 238mus;if strip continues to stay reassuring will increases by 1mu per protocol; if not will do amnioinfusion          Anticipate svd

## 2016-07-15 ENCOUNTER — Encounter (HOSPITAL_COMMUNITY): Payer: Self-pay | Admitting: Obstetrics and Gynecology

## 2016-07-15 LAB — APTT: APTT: 40 s — AB (ref 24–36)

## 2016-07-15 LAB — CBC
HCT: 23.4 % — ABNORMAL LOW (ref 36.0–46.0)
HCT: 21.9 % — ABNORMAL LOW (ref 36.0–46.0)
HEMOGLOBIN: 7.4 g/dL — AB (ref 12.0–15.0)
Hemoglobin: 7.1 g/dL — ABNORMAL LOW (ref 12.0–15.0)
MCH: 24.4 pg — ABNORMAL LOW (ref 26.0–34.0)
MCH: 24.7 pg — ABNORMAL LOW (ref 26.0–34.0)
MCHC: 31.6 g/dL (ref 30.0–36.0)
MCHC: 32.4 g/dL (ref 30.0–36.0)
MCV: 76.3 fL — AB (ref 78.0–100.0)
MCV: 77.2 fL — ABNORMAL LOW (ref 78.0–100.0)
PLATELETS: 160 10*3/uL (ref 150–400)
Platelets: 172 10*3/uL (ref 150–400)
RBC: 2.87 MIL/uL — AB (ref 3.87–5.11)
RBC: 3.03 MIL/uL — ABNORMAL LOW (ref 3.87–5.11)
RDW: 16.5 % — ABNORMAL HIGH (ref 11.5–15.5)
RDW: 16.4 % — ABNORMAL HIGH (ref 11.5–15.5)
WBC: 28.3 10*3/uL — AB (ref 4.0–10.5)
WBC: 28.4 10*3/uL — ABNORMAL HIGH (ref 4.0–10.5)

## 2016-07-15 LAB — FIBRINOGEN: FIBRINOGEN: 403 mg/dL (ref 210–475)

## 2016-07-15 LAB — PROTIME-INR
INR: 1.2
PROTHROMBIN TIME: 15.3 s — AB (ref 11.4–15.2)

## 2016-07-15 MED ORDER — AMPICILLIN 500 MG PO CAPS
500.0000 mg | ORAL_CAPSULE | Freq: Two times a day (BID) | ORAL | Status: DC
  Administered 2016-07-15 – 2016-07-17 (×4): 500 mg via ORAL
  Filled 2016-07-15 (×4): qty 1

## 2016-07-15 NOTE — Addendum Note (Signed)
Addendum  created 07/15/16 [id number] by Graciela HusbandsWynn O Decklin Weddington, CRNA   Sign clinical note

## 2016-07-15 NOTE — Anesthesia Postprocedure Evaluation (Signed)
Anesthesia Post Note  Patient: Crystal Church  Procedure(s) Performed: Procedure(s) (LRB): CESAREAN SECTION (N/A)  Patient location during evaluation: PACU Anesthesia Type: Epidural Level of consciousness: awake Pain management: pain level controlled Vital Signs Assessment: post-procedure vital signs reviewed and stable Respiratory status: spontaneous breathing Cardiovascular status: stable Postop Assessment: epidural receding and no signs of nausea or vomiting Anesthetic complications: no     Last Vitals:  Vitals:   07/15/16 0000 07/15/16 0100  BP: 117/74 106/60  Pulse: (!) 126 89  Resp: 20 20  Temp: 37.4 C 36.4 C    Last Pain:  Vitals:   07/15/16 0100  TempSrc: Oral  PainSc: 0-No pain   Pain Goal:                 Yarenis Cerino

## 2016-07-15 NOTE — Progress Notes (Signed)
Patient ID: Crystal Church, female   DOB: 1991/08/27, 25 y.o.   MRN: [id number] POD#1  Pt doing well. Pain well controlled, lochia mild, tolerating po and voiding well. Denies any fever or chills.Has no complaints. Bonding well with baby - working on breastfeeding VSS- afeb ABD -  soft, ND, dressing c/d/i EXT - +1 edema b/a, no homans  Hg - 7.1 ( from 10)  A/P: POD # 1 s/p pltc/s for maternal temp remote from delivery; stable         Continue on amp/gent x 48hours from delivery         Routine pp/post op care

## 2016-07-15 NOTE — Plan of Care (Signed)
Problem: Role Relationship: Goal: Ability to demonstrate positive interaction with the child will improve Outcome: Completed/Met Date Met: 07/15/16 Parents are doing a lot of skin to skin and sharing the responsibility for caring for the baby.  Problem: Pain Management: Goal: General experience of comfort will improve and pain level will decrease Outcome: Completed/Met Date Met: 07/15/16 Good pain relief with Motrin at this time per patient.  Problem: Bowel/Gastric: Goal: Gastrointestinal status will improve Outcome: Completed/Met Date Met: 07/15/16 Tolerating a Regular diet and has passed flatus.  Problem: Respiratory: Goal: Ability to maintain adequate ventilation will improve Outcome: Completed/Met Date Met: 07/15/16 No DOE or SOB with ambulation and activity.  Problem: Urinary Elimination: Goal: Ability to reestablish a normal urinary elimination pattern will improve Outcome: Completed/Met Date Met: 07/15/16 Voiding large amounts since Foley removed.

## 2016-07-15 NOTE — Anesthesia Postprocedure Evaluation (Signed)
Anesthesia Post Note  Patient: Crystal Church  Procedure(s) Performed: Procedure(s) (LRB): CESAREAN SECTION (N/A)  Patient location during evaluation: Mother Baby Anesthesia Type: Epidural Level of consciousness: awake and alert Pain management: satisfactory to patient Vital Signs Assessment: post-procedure vital signs reviewed and stable Respiratory status: respiratory function stable Cardiovascular status: stable Postop Assessment: no headache, no backache, epidural receding, patient able to bend at knees, no signs of nausea or vomiting and adequate PO intake Anesthetic complications: no     Last Vitals:  Vitals:   07/15/16 0100 07/15/16 0200  BP: 106/60 (!) 99/55  Pulse: 89 87  Resp: 20 18  Temp: 36.4 C 37.1 C    Last Pain:  Vitals:   07/15/16 0615  TempSrc:   PainSc: 0-No pain   Pain Goal:                 Crystal Church

## 2016-07-15 NOTE — Lactation Note (Signed)
This note was copied from a baby's chart. Lactation Consultation Note  Patient Name: Crystal Church Date: 07/15/2016 Reason for consult: Initial assessment   Initial consult at 20 hrs old; GA 38.5; BW 7 lbs, 6 oz.  Mom is P1.  Hx Gonorrhea and possible chorio -ABX; Infant had dusky episode earlier with O2 sats 100%.  C-Section for IOL for PROM; Maternal Temp; MomTx with ABX.  Apgars 10/10 Infant has had ALL ATTEMPTS with breastfeeding x6 since birth; no sustaining latch reported by mom; voids-1; stools-1; LS-4-6 by RN.   LC in to see parents and mom was attempting to breastfeed upon entering room.  She had infant in cradle hold on left side but infant was not sucking. LC taught cross-cradle hold but was too awkward for mom to hold with large, soft, floppy breast and semi-flat, short shafted nipples.  Could get infant to latch and only take a few sucks but then would stop and lose latch.   Could not get infant to latch and suck on gloved finger.  Discussed with parents options to pump, use nipple shield, and give EBM.  Mom was open to all suggestions. LC worked with mom to hand express and was easily able to hand express 3 ml colostrum.  Mom has good flow of colostrum with hand expression from breast.   LC applied NS#20 and prefilled with colostrum.  Infant latched and took a few sucks but then began "humming" grunting at breast and would not suck anymore. LC had parents wrap infant to spoon feed remaining EBM, applied infant's cap, and told to put STS after spoon feeding.   Mom and dad spoon fed remaining colostrum to infant.  Discussed the need to feed 8-12 times per day; discussed respiratory issues LC witnessed toward end of feeding and the need for staff to follow closely. Lactation brochure given and informed of hospital support group and outpatient services. Encouraged mom to begin pumping after each feeding (DEBP set up in room) using hands-on pumping and hand expression at end  of pumping session.   Encouraged to feed EBM to infant with each feeding.  Mom verbalized understanding of need to supplement with extra EBM.  Instructed that if infant is not sucking at breast then limit breastfeeding to 10-15 minutes and then supplement with Since mom has good milk supply; instructed parents to feed 5-10 ml (starting with 5 and increasing to 10 ml) of EBM with each feeding to assure infant is fed needed calories since infant has a poor feeding record for today.   LC reported to nursery of increased respirations, and humming grunting noises at breast. Tech checked temp (98.8) and O2 sats (97%).   (At 1900 respirations 80).   Maternal Data Formula Feeding for Exclusion: No Has patient been taught Hand Expression?: Yes Does the patient have breastfeeding experience prior to this delivery?: No  Feeding Feeding Type: Breast Fed Length of feed: 1 min  LATCH Score/Interventions Latch: Repeated attempts needed to sustain latch, nipple held in mouth throughout feeding, stimulation needed to elicit sucking reflex. Intervention(s): Teach feeding cues;Skin to skin Intervention(s): Breast compression;Assist with latch  Audible Swallowing: None Intervention(s): Hand expression;Skin to skin  Type of Nipple: Flat (semi-flat with compressions; short shafted nipple ) Intervention(s): Double electric pump  Comfort (Breast/Nipple): Soft / non-tender     Hold (Positioning): Assistance needed to correctly position infant at breast and maintain latch. Intervention(s): Position options;Support Pillows;Breastfeeding basics reviewed;Skin to skin  LATCH Score: 5  Lactation Tools  Discussed/Used Tools: Nipple Shields Nipple shield size: 20 Breast pump type: Double-Electric Breast Pump WIC Program: No Pump Review: Milk Storage;Other (comment) (Encouraged to post-pump and hand express for EBM feedings) Initiated by:: RN Date initiated:: 07/15/16   Consult Status Consult Status:  Follow-up Date: 07/16/16 Follow-up type: In-patient    Lendon KaVann, Anselma Herbel Walker 07/15/2016, 5:48 PM

## 2016-07-16 NOTE — Lactation Note (Signed)
This note was copied from a baby's chart. Lactation Consultation Note  Patient Name: Girl Crystal Church WUJWJ'XToday's Date: 07/16/2016 Reason for consult: Follow-up assessment;Difficult latch Follow up visit made.  Baby is now 6542 hours old and mostly getting syringe feeds with Alimentum.  Mom is hand expressing some and giving a few mls by spoon.  She is not pumping because she wasn't obtaining milk with pump.  Reinforced the importance of pumping for establishing milk supply.  Baby was recently syringe fed 10 mls of formula.  Parents instructed to increase volume to 15-30 mls every 3 hours. Assisted with positioning baby at breast in football hold.  Baby grunting softly off and on.  Baby unable to latch to breast so nipple shield with 5 french feeding tube/syringe used.  Baby sucks shallow and does not pull nipple into back of her mouth.  Dimpling seen in cheeks. Recommended using a slow flow nipple for suck training and parents shown how to pace feed.  Baby took 10 mls from bottle but still did not draw bottle nipple in deep.  I showed parents how to work nipple gently further back and untuck lips. Plan is for mom to pump breasts every 3 hours followed by hand expression and bottle feed baby with 15-30 mls of expressed milk/formula.  Once baby is doing better with bottle attempt to work on latching to breast. Encouraged to call for assist/concerns prn.  Maternal Data    Feeding Feeding Type: Breast Fed Length of feed: 15 min (on and off )  LATCH Score/Interventions Latch: Repeated attempts needed to sustain latch, nipple held in mouth throughout feeding, stimulation needed to elicit sucking reflex. Intervention(s): Skin to skin;Waking techniques Intervention(s): Adjust position;Assist with latch;Breast massage;Breast compression  Audible Swallowing: None Intervention(s): Skin to skin;Hand expression  Type of Nipple: Flat Intervention(s): Double electric pump  Comfort (Breast/Nipple): Soft  / non-tender     Hold (Positioning): Assistance needed to correctly position infant at breast and maintain latch. Intervention(s): Breastfeeding basics reviewed;Support Pillows;Skin to skin  LATCH Score: 5  Lactation Tools Discussed/Used Tools: 61F feeding tube / Syringe   Consult Status      Huston FoleyMOULDEN, Cabrini Ruggieri S 07/16/2016, 2:55 PM

## 2016-07-16 NOTE — Progress Notes (Signed)
Patient ID: [name], female   DOB: 1990/12/09, 25 y.o.   MRN: [id number] Pt doing well. Worried about breastfeeding issues; has tried nipple shield but no improvement. She denies any fever or chills and pain well controlled. Considering formula  VSS ( Tmax 100.2 on 11/17 at 2100) ABD- soft, ND, dressing c/d/i EXT +1 edema b/l, no Homans  A/P: POD # 2 s/p pltc/s for maternal temp remote from            delivery; stable         Due to infiltrated iv pt switched to po amp today;               can d/c after tonight          Discussed Breastfeeding concerns          Routine pp/post op care

## 2016-07-17 MED ORDER — IBUPROFEN 600 MG PO TABS
600.0000 mg | ORAL_TABLET | Freq: Four times a day (QID) | ORAL | 0 refills | Status: AC
Start: 2016-07-17 — End: ?

## 2016-07-17 MED ORDER — OXYCODONE HCL 5 MG PO TABS: 5.0000 mg | ORAL_TABLET | ORAL | 0 refills | Status: AC | PRN

## 2016-07-17 NOTE — Lactation Note (Signed)
This note was copied from a baby's chart. Lactation Consultation Note  Patient Name: Crystal Church BJYNW'GToday's Date: 07/17/2016 Reason for consult: Follow-up assessment   Follow up with mom of 62 hour old infant. Infant with 1 BF for 15 minutes, 1 attempt, 2 syringe feeds of Alimentum of 2-10 cc, 8 bottle feeds of Alimentum of 10-20 cc, 4 voids and 6 stools in 24 hours preceding this assessment. LATCH Score 5 by bedside RN. Infant weight 7 lb 0.2 oz with 5% weight loss. Phototherapy has been d/c.   Mom reports she plans to BF. She reports she has put infant to breast a couple of times and infant does not suck well at the breast. Infant was fussing in dad's arms. Parents report she last fed at 7 am. Offered to assist mom with latching infant, mom declined and wanted to offer a bottle. Enc parents to increase infant feeding amount to at least 30 cc at each feeding, mom voiced infant acts like she will take more at times and other times she will not. Enc mom to offer breast prior to bottle feeding and to offer any EBM to infant prior to formula.   Mom reports she has pumped a few times. She reports she is aware she needs to stimulate the breasts even thought she is not getting much colostrum right now. Discussed supply and demand and milk coming to volume. Engorgement prevention/treatment reviewed with mom. Per mom, she has an [name] pump at home for use.   BF information in Taking Care of Baby and Me Booklet Reviewed. Infant with f/u Ped appt on Wed 11/22 in the afternoon. [facility], mom aware of OP services, BF Support Groups and LC phone #. Mom declined scheduling and OP appt prior to d/c.    Maternal Data Does the patient have breastfeeding experience prior to this delivery?: No  Feeding Feeding Type: Bottle Fed - Formula (mom encouraged to call before next feeding for latch) Nipple Type: Slow - flow  LATCH Score/Interventions                      Lactation Tools  Discussed/Used WIC Program: No Pump Review: Setup, frequency, and cleaning;Milk Storage   Consult Status Consult Status: Complete Follow-up type: Call as needed    Crystal BlalockSharon S Church 07/17/2016, 10:13 AM

## 2016-07-17 NOTE — Progress Notes (Signed)
Subjective: Postpartum Day 3: Cesarean Delivery Patient reports tolerating PO, + flatus and no problems voiding.    Objective: Vital signs in last 24 hours: Temp:  [98.1 F (36.7 C)-98.5 F (36.9 C)] 98.1 F (36.7 C) (11/20 0542) Pulse Rate:  [106-124] 106 (11/20 0542) Resp:  [18] 18 (11/20 0542) BP: (112-124)/(59-64) 112/64 (11/20 0542)  Physical Exam:  General: alert and cooperative Lochia: appropriate Uterine Fundus: firm Incision: C/D/I pressure dressing still on    Recent Labs  07/15/16 0053 07/15/16 0512  HGB 7.4* 7.1*  HCT 23.4* 21.9*    Assessment/Plan: Status post Cesarean section. Doing well postoperatively.  Discharge home with standard precautions and return to office in 2 weeks. Instructed on removal of pressure dressing prior to d/c and then incision care after d/c--wants to wait until in shower to remove  Kavi Almquist W 07/17/2016, 9:16 AM

## 2016-07-17 NOTE — Discharge Summary (Signed)
    OB Discharge Summary     Patient Name: Crystal Church DOB: [birth year] MRN: [id number]  Date of admission: 07/13/2016 Delivering MD: Pryor OchoaBANGA, CECILIA [facility]   Date of discharge: 07/17/2016  Admitting diagnosis: 38 wks water broke Intrauterine pregnancy: 242w5d     Secondary diagnosis:  Active Problems:   Normal labor   Cesarean delivery delivered   Postpartum care following cesarean delivery  Additional problems: maternal fever in labor, chorioamnionitis     Discharge diagnosis: Term Pregnancy Delivered                                                                                                Post partum procedures:antibiotics  Augmentation: Pitocin  Complications: None  Hospital course:  Onset of Labor With Unplanned C/S  25 y.o. yo G1P1001 at 5442w5d was admitted in Latent Labor on 07/13/2016. Patient had a labor course significant for PROM. Membrane Rupture Time/Date: 12:30 AM ,07/13/2016   The patient went for cesarean section due to Arrest of Dilation and persistent category 2 tracing, and delivered a Viable infant,07/14/2016  Details of operation can be found in separate operative note. Patient had an uncomplicated postpartum course.  She is ambulating,tolerating a regular diet, passing flatus, and urinating well.  Patient is discharged home in stable condition 07/17/16.   Physical exam Vitals:   07/15/16 2004 07/16/16 0538 07/16/16 1800 07/17/16 0542  BP: 129/69 107/63 (!) 124/59 112/64  Pulse: (!) 110 (!) 112 (!) 124 (!) 106  Resp: 20 15  18   Temp: 98.4 F (36.9 C) 98.1 F (36.7 C) 98.5 F (36.9 C) 98.1 F (36.7 C)  TempSrc: Oral Oral Oral Oral  SpO2: 100% 100%    Weight:      Height:       General: alert and cooperative Lochia: appropriate Uterine Fundus: firm Incision: Dressing is clean, dry, and intact  Labs: Lab Results  Component Value Date   WBC 28.3 (H) 07/15/2016   HGB 7.1 (L) 07/15/2016   HCT 21.9 (L) 07/15/2016   MCV 76.3 (L) 07/15/2016   PLT 160 07/15/2016   No flowsheet data found.  Discharge instruction: per After Visit Summary and "Baby and Me Booklet".  After visit meds:    Medication List    TAKE these medications   ibuprofen 600 MG tablet Commonly known as:  ADVIL,MOTRIN Take 1 tablet (600 mg total) by mouth every 6 (six) hours.   oxyCODONE 5 MG immediate release tablet Commonly known as:  Oxy IR/ROXICODONE Take 1 tablet (5 mg total) by mouth every 4 (four) hours as needed (pain scale 4-7).       Diet: routine diet  Activity: Advance as tolerated. Pelvic rest for 6 weeks.   Outpatient follow up:2 weeks Follow up Appt:No future appointments. Follow up Visit:No Follow-up on file.  Postpartum contraception: Undecided  Newborn Data: Live born female  Birth Weight: 7 lb 6.5 oz (3360 g) APGAR: 10, 10  Baby Feeding: Breast Disposition:home with mother   07/17/2016 Oliver PilaICHARDSON,Lisaann Atha W, MD

## 2016-08-02 ENCOUNTER — Encounter (HOSPITAL_COMMUNITY): Payer: Self-pay

## 2016-08-02 ENCOUNTER — Emergency Department (HOSPITAL_COMMUNITY)
Admission: EM | Admit: 2016-08-02 | Discharge: 2016-08-02 | Disposition: A | Discharge status: Home / Self Care | Payer: BLUE CROSS/BLUE SHIELD | Attending: Emergency Medicine | Admitting: Emergency Medicine

## 2016-08-02 DIAGNOSIS — M62830 Muscle spasm of back: Secondary | ICD-10-CM | POA: Insufficient documentation

## 2016-08-02 DIAGNOSIS — M549 Dorsalgia, unspecified: Secondary | ICD-10-CM | POA: Diagnosis present

## 2016-08-02 NOTE — ED Notes (Signed)
Papers reviewed and coupon given #252Patient given MD Live coupon code 252 [facility].

## 2016-08-02 NOTE — Discharge Instructions (Signed)
Please read and follow all provided instructions.  Your diagnoses today include:  1. Spasm of thoracic back muscle    Tests performed today include:  Vital signs - see below for your results today  Medications prescribed:   None  Take any prescribed medications only as directed.  Home care instructions:   Follow any educational materials contained in this packet  Continue tylenol for your back pain.   Please rest, use ice or heat on your back for the next several days  Do not lift, push, pull anything more than 10 pounds for the next week  Follow-up instructions: Please follow-up with your primary care provider in the next 1 week for further evaluation of your symptoms.   Return instructions:  SEEK IMMEDIATE MEDICAL ATTENTION IF YOU HAVE:  New numbness, tingling, weakness, or problem with the use of your arms or legs  Severe back pain not relieved with medications  Loss control of your bowels or bladder  Increasing pain in any areas of the body (such as chest or abdominal pain)  Shortness of breath, dizziness, or fainting.   Worsening nausea (feeling sick to your stomach), vomiting, fever, or sweats  Any other emergent concerns regarding your health   Additional Information:  Your vital signs today were: BP 119/76 (BP Location: Right Arm)    Pulse 115    Temp 97.8 F (36.6 C) (Oral)    Resp 18    SpO2 100%  If your blood pressure (BP) was elevated above 135/85 this visit, please have this repeated by your doctor within one month. --------------

## 2016-08-02 NOTE — ED Triage Notes (Signed)
Patient complains of left sided back pain since giving birth 3 weeks ago following section. Pain worse with movement and change in position. Using otc meds with no relief

## 2016-08-02 NOTE — ED Provider Notes (Signed)
[facility]-EMERGENCY DEPT Provider Note   CSN: [id number] Arrival date & time: 08/02/16  1224   By signing my name below, I, Crystal Church, attest that this documentation has been prepared under the direction and in the presence of  Crystal Kobee Medlen PA-C. Electronically Signed: Clovis PuAvnee Church, ED Scribe. 08/02/16. 1:22 PM.   History   Chief Complaint Chief Complaint  Patient presents with  . Back Pain   The history is provided by the patient. No language interpreter was used.   HPI Comments:  Crystal Church is a 25 y.o. female who presents to the Emergency Department complaining of sudden onset, constant back pain x 3 days. Her pain is worse with certain movements. Pt states has also been experiencing tightness and mild pain with urination. She notes she gave birth 3 weeks ago. Pt has taken tylenol, oxycodone, ibuprofen with no relief. Pt denies weakness, fevers, bowel/bladder incontinence, SOB, cough, any other associated symptoms and modifying factors at this time.    Past Medical History:  Diagnosis Date  . Hx of gonorrhea 2012  . Medical history non-contributory     Patient Active Problem List   Diagnosis Date Noted  . Cesarean delivery delivered 07/14/2016  . Postpartum care following cesarean delivery 07/14/2016  . Normal labor 07/13/2016    Past Surgical History:  Procedure Laterality Date  . CESAREAN SECTION N/A 07/14/2016   Procedure: CESAREAN SECTION;  Surgeon: Edwinna Areolaecilia Worema Banga, DO;  Location: [facility];  Service: Obstetrics;  Laterality: N/A;  . NO PAST SURGERIES      OB History    Gravida Para Term Preterm AB Living   1 1 1     1    SAB TAB Ectopic Multiple Live Births         0 1       Home Medications    Prior to Admission medications   Medication Sig Start Date End Date Taking? Authorizing Provider  ibuprofen (ADVIL,MOTRIN) 600 MG tablet Take 1 tablet (600 mg total) by mouth every 6 (six) hours. 07/17/16   Huel CoteKathy Richardson, MD  oxyCODONE (OXY  IR/ROXICODONE) 5 MG immediate release tablet Take 1 tablet (5 mg total) by mouth every 4 (four) hours as needed (pain scale 4-7). 07/17/16   Huel CoteKathy Richardson, MD    Family History Family History  Problem Relation Age of Onset  . Heart disease Maternal Grandmother   . Heart disease Maternal Grandfather     Social History Social History  Substance Use Topics  . Smoking status: Never Smoker  . Smokeless tobacco: Never Used  . Alcohol use No     Allergies   Patient has no known allergies.   Review of Systems Review of Systems  Constitutional: Negative for fever and unexpected weight change.  Respiratory: Negative for cough and shortness of breath.   Gastrointestinal: Negative for constipation.       Negative for fecal incontinence.   Genitourinary: Positive for dysuria. Negative for flank pain, hematuria, pelvic pain, vaginal bleeding and vaginal discharge.       Negative for urinary incontinence or retention.  Musculoskeletal: Positive for back pain.  Neurological: Negative for weakness and numbness.       Denies saddle paresthesias.     Physical Exam Updated Vital Signs BP 119/76 (BP Location: Right Arm)   Pulse 115   Temp 97.8 F (36.6 C) (Oral)   Resp 18   SpO2 100%   Physical Exam  Constitutional: She appears well-developed and well-nourished. No distress.  HENT:  Head: Normocephalic and atraumatic.  Eyes: Conjunctivae are normal.  Neck: Normal range of motion. Neck supple.  Pulmonary/Chest: Effort normal.  Abdominal: Soft. She exhibits no distension. There is no tenderness. There is no CVA tenderness.  Musculoskeletal: Normal range of motion.       Cervical back: She exhibits normal range of motion, no tenderness and no bony tenderness.       Thoracic back: She exhibits tenderness. She exhibits normal range of motion and no bony tenderness.       Lumbar back: She exhibits normal range of motion, no tenderness and no bony tenderness.       Back:  No  step-off noted with palpation of spine.   Neurological: She is alert. She has normal strength and normal reflexes. No sensory deficit.  5/5 strength in entire lower extremities bilaterally. No sensation deficit.   Skin: Skin is warm and dry. No rash noted.  Psychiatric: She has a normal mood and affect.  Nursing note and vitals reviewed.   ED Treatments / Results  DIAGNOSTIC STUDIES:  Oxygen Saturation is 100% on RA, normal by my interpretation.    COORDINATION OF CARE:  1:15 PM Advised pt to take Tylenol, apply heat and try stretching to help with pain. Discussed treatment plan with pt at bedside and pt agreed to plan.  Procedures Procedures (including critical care time)  Medications Ordered in ED Medications - No data to display   Initial Impression / Assessment and Plan / ED Course  I have reviewed the triage vital signs and the nursing notes.  Pertinent labs & imaging results that were available during my care of the patient were reviewed by me and considered in my medical decision making (see chart for details).  Clinical Course     Patient with back pain.  No neurological deficits and normal neuro exam.  Patient is ambulatory.  No loss of bowel or bladder control.  No concern for cauda equina.  No fever. No recent procedure to back. No urinary symptoms suggestive of UTI.  Supportive care and return precaution discussed. Appears safe for discharge at this time. Follow up as indicated in discharge paperwork.    Final Clinical Impressions(s) / ED Diagnoses   Final diagnoses:  Spasm of thoracic back muscle   Patient with isolated right thoracic paraspinous muscular tenderness that feels better with palpation, worse with movement. Symptoms are readily reproducible. Patient does not have any chest pain or shortness of breath to suggest PE. Patient noted to be mildly tachycardic in the emergency department. She was also noted to the tachycardic at discharge from her [facility] (110, 112, 124, 106). Doubt that this is related to her current symptoms.   New Prescriptions New Prescriptions   No medications on file  I personally performed the services described in this documentation, which was scribed in my presence. The recorded information has been reviewed and is accurate.     Renne CriglerJoshua Naviah Belfield, PA-C 08/02/16 1333    Nira ConnPedro Eduardo Cardama, MD 08/03/16 1134

## 2016-11-13 ENCOUNTER — Emergency Department (HOSPITAL_COMMUNITY): Payer: BLUE CROSS/BLUE SHIELD

## 2016-11-13 ENCOUNTER — Emergency Department (HOSPITAL_COMMUNITY)
Admission: EM | Admit: 2016-11-13 | Discharge: 2016-11-13 | Disposition: A | Discharge status: Home / Self Care | Payer: BLUE CROSS/BLUE SHIELD | Attending: Emergency Medicine | Admitting: Emergency Medicine

## 2016-11-13 ENCOUNTER — Encounter (HOSPITAL_COMMUNITY): Payer: Self-pay

## 2016-11-13 DIAGNOSIS — E876 Hypokalemia: Secondary | ICD-10-CM | POA: Insufficient documentation

## 2016-11-13 DIAGNOSIS — D649 Anemia, unspecified: Secondary | ICD-10-CM | POA: Insufficient documentation

## 2016-11-13 DIAGNOSIS — N8312 Corpus luteum cyst of left ovary: Secondary | ICD-10-CM | POA: Diagnosis not present

## 2016-11-13 DIAGNOSIS — B9689 Other specified bacterial agents as the cause of diseases classified elsewhere: Secondary | ICD-10-CM

## 2016-11-13 DIAGNOSIS — N760 Acute vaginitis: Secondary | ICD-10-CM | POA: Diagnosis not present

## 2016-11-13 DIAGNOSIS — R102 Pelvic and perineal pain: Secondary | ICD-10-CM | POA: Diagnosis present

## 2016-11-13 LAB — COMPREHENSIVE METABOLIC PANEL
ALT: 11 U/L — AB (ref 14–54)
ANION GAP: 7 (ref 5–15)
AST: 19 U/L (ref 15–41)
Albumin: 4.1 g/dL (ref 3.5–5.0)
Alkaline Phosphatase: 110 U/L (ref 38–126)
BUN: 11 mg/dL (ref 6–20)
CHLORIDE: 105 mmol/L (ref 101–111)
CO2: 24 mmol/L (ref 22–32)
Calcium: 9.2 mg/dL (ref 8.9–10.3)
Creatinine, Ser: 0.92 mg/dL (ref 0.44–1.00)
GFR calc non Af Amer: >60 mL/min (ref >60)
Glucose, Bld: 100 mg/dL — ABNORMAL HIGH (ref 65–99)
POTASSIUM: 3.3 mmol/L — AB (ref 3.5–5.1)
SODIUM: 136 mmol/L (ref 135–145)
Total Bilirubin: 0.2 mg/dL — ABNORMAL LOW (ref 0.3–1.2)
Total Protein: 8.4 g/dL — ABNORMAL HIGH (ref 6.5–8.1)

## 2016-11-13 LAB — WET PREP, GENITAL
SPERM: NONE SEEN
TRICH WET PREP: NONE SEEN
YEAST WET PREP: NONE SEEN

## 2016-11-13 LAB — URINALYSIS, ROUTINE W REFLEX MICROSCOPIC
BILIRUBIN URINE: NEGATIVE
GLUCOSE, UA: NEGATIVE mg/dL
HGB URINE DIPSTICK: NEGATIVE
Ketones, ur: 5 mg/dL — AB
Leukocytes, UA: NEGATIVE
Nitrite: NEGATIVE
PH: 6 (ref 5.0–8.0)
Protein, ur: NEGATIVE mg/dL
SPECIFIC GRAVITY, URINE: 1.027 (ref 1.005–1.030)

## 2016-11-13 LAB — CBC WITH DIFFERENTIAL/PLATELET
Basophils Absolute: 0 10*3/uL (ref 0.0–0.1)
Basophils Relative: 0 %
EOS ABS: 0.1 10*3/uL (ref 0.0–0.7)
EOS PCT: 1 %
HCT: 29 % — ABNORMAL LOW (ref 36.0–46.0)
Hemoglobin: 9 g/dL — ABNORMAL LOW (ref 12.0–15.0)
LYMPHS ABS: 3.1 10*3/uL (ref 0.7–4.0)
LYMPHS PCT: 34 %
MCH: 22.3 pg — AB (ref 26.0–34.0)
MCHC: 31 g/dL (ref 30.0–36.0)
MCV: 72 fL — ABNORMAL LOW (ref 78.0–100.0)
MONOS PCT: 7 %
Monocytes Absolute: 0.7 10*3/uL (ref 0.1–1.0)
Neutro Abs: 5.2 10*3/uL (ref 1.7–7.7)
Neutrophils Relative %: 58 %
PLATELETS: 279 10*3/uL (ref 150–400)
RBC: 4.03 MIL/uL (ref 3.87–5.11)
RDW: 17.7 % — AB (ref 11.5–15.5)
WBC: 9.1 10*3/uL (ref 4.0–10.5)

## 2016-11-13 LAB — POC URINE PREG, ED: Preg Test, Ur: NEGATIVE

## 2016-11-13 LAB — HIV ANTIBODY (ROUTINE TESTING W REFLEX): HIV Screen 4th Generation wRfx: NONREACTIVE

## 2016-11-13 MED ORDER — FERROUS SULFATE 325 (65 FE) MG PO TABS: 325.0000 mg | ORAL_TABLET | Freq: Every day | ORAL | 0 refills | Status: AC

## 2016-11-13 MED ORDER — METRONIDAZOLE 500 MG PO TABS: 500.0000 mg | ORAL_TABLET | Freq: Two times a day (BID) | ORAL | 0 refills | Status: AC

## 2016-11-13 MED ORDER — POTASSIUM CHLORIDE CRYS ER 20 MEQ PO TBCR
40.0000 meq | EXTENDED_RELEASE_TABLET | Freq: Once | ORAL | Status: AC
  Administered 2016-11-13: 40 meq via ORAL
  Filled 2016-11-13: qty 2

## 2016-11-13 NOTE — ED Triage Notes (Signed)
Pt states that she started experiencing sharp pains in her uterus starting around 2300 last night. She also states that the last time she urinated, it was painful. Recent unprotected sex. A&Ox4.

## 2016-11-13 NOTE — ED Notes (Signed)
Ultrasound at bedside

## 2016-11-13 NOTE — Discharge Instructions (Signed)
Your work up revealed that you have mild anemia, take iron as instructed. Your work up also showed that you had a mildly low potassium level, but you were given potassium here so this should take care of it, but see the list of foods below to help supplement your diet. Your vaginal swabs showed that you have bacterial vaginosis; take flagyl as directed until finished, and do not drink alcohol while taking this medication. Your ultrasound showed a small cyst in the left ovary, which is likely related to ovulation, and will likely resolve on its own. Alternate between tylenol and motrin as needed for pain. Stay well hydrated with plenty of water. Use heating pad or warm baths as needed for additional relief.  Also, you have been tested for gonorrhea, chlamydia, HIV, and Syphilis, and the hospital will call you if the lab is positive. DO NOT ENGAGE IN SEXUAL ACTIVITY UNTIL YOU FIND OUT ABOUT YOUR RESULTS AND HAVE PARTNERS TESTED AND TREATED. ALL PARTNERS MUST BE TESTED AND TREATED FOR STD'S. ALWAYS USE CONDOMS WHEN ENGAGING IN INTERCOURSE. [facility] concerns or screenings.   Follow up with your regular OBGYN in 5-7 days for recheck of symptoms. [facility]) for changes or worsening symptoms.

## 2016-11-13 NOTE — ED Provider Notes (Signed)
[facility]-EMERGENCY DEPT Provider Note   CSN: [id number] Arrival date & time: 11/13/16  0139   By signing my name below, I, Teofilo Pod, attest that this documentation has been prepared under the direction and in the presence of [address], [employer]. Electronically Signed: Teofilo Pod, ED Scribe. 11/13/2016. 1:59 AM.   History   Chief Complaint Chief Complaint  Patient presents with  . Pelvic Pain     The history is provided by the patient and medical records.  Pelvic Pain  This is a new problem. The current episode started 3 to 5 hours ago. The problem occurs constantly. The problem has been gradually improving. Associated symptoms include abdominal pain. Pertinent negatives include no chest pain and no shortness of breath. Exacerbated by: movement. The symptoms are relieved by acetaminophen. She has tried acetaminophen for the symptoms. The treatment provided mild relief.   HPI Comments:  Crystal Church is a 26 y.o. [id number] female who is 4 months post-partum after C/S, still breastfeeding, with no other significant PMHx or PSHx, who presents to the Emergency Department complaining of gradual onset constant pelvic pain that began earlier this afternoon but became worse 3 hours ago. Pt states that earlier in the afternoon she was having some mild lower abd pain but felt like it was nothing significant, however she was sleeping around 11pm (3hrs ago) when she was awoken by more severe sharp pains "in my uterus" that she reports felt like labor contractions. The pain has improved somewhat since onset, however, and is now less intense. She describes the pain as initially 8/10 but now 5/10, constant, sharp contraction-like nonradiating lower abd pain, worse with movement, and mildly improved with tylenol. She also reports that earlier she had some mild nausea which has since subsided, and that when her pain intensified, she had some mild discomfort with urination however she didn't feel  any burning with urination. LNMP was 2 months ago after she was started on oral contraceptives, hasn't had one since then. She is sexually active with 1 female partner and has both protected and unprotected sex. Denies any recent travel, EtOH use, sick contacts, suspicious food intake, or regular NSAID use. She is otherwise healthy. She denies fevers, chills, CP, SOB, ongoing nausea, vomiting, diarrhea, constipation, melena, hematochezia, obstipation, hematuria, urinary frequency, malodorous urine, vaginal bleeding/discharge, myalgias, arthralgias, numbness, tingling, focal weakness, or any other complaints at this time.    Past Medical History:  Diagnosis Date  . Hx of gonorrhea 2012  . Medical history non-contributory     Patient Active Problem List   Diagnosis Date Noted  . Cesarean delivery delivered 07/14/2016  . Postpartum care following cesarean delivery 07/14/2016  . Normal labor 07/13/2016    Past Surgical History:  Procedure Laterality Date  . CESAREAN SECTION N/A 07/14/2016   Procedure: CESAREAN SECTION;  Surgeon: Edwinna Areola, DO;  Location: [facility];  Service: Obstetrics;  Laterality: N/A;  . NO PAST SURGERIES      OB History    Gravida Para Term Preterm AB Living   1 1 1     1    SAB TAB Ectopic Multiple Live Births         0 1       Home Medications    Prior to Admission medications   Medication Sig Start Date End Date Taking? Authorizing Provider  ibuprofen (ADVIL,MOTRIN) 600 MG tablet Take 1 tablet (600 mg total) by mouth every 6 (six) hours. 07/17/16   Huel Cote, MD  oxyCODONE (OXY IR/ROXICODONE) 5 MG immediate release tablet Take 1 tablet (5 mg total) by mouth every 4 (four) hours as needed (pain scale 4-7). 07/17/16   Huel Cote, MD    Family History Family History  Problem Relation Age of Onset  . Heart disease Maternal Grandmother   . Heart disease Maternal Grandfather     Social History Social History  Substance Use  Topics  . Smoking status: Never Smoker  . Smokeless tobacco: Never Used  . Alcohol use No     Allergies   Patient has no known allergies.   Review of Systems Review of Systems  Constitutional: Negative for chills and fever.  Respiratory: Negative for shortness of breath.   Cardiovascular: Negative for chest pain.  Gastrointestinal: Positive for abdominal pain and nausea (earlier, now resolved). Negative for blood in stool, constipation, diarrhea and vomiting.  Genitourinary: Positive for dysuria (mild discomfort, no burning) and pelvic pain. Negative for frequency, hematuria, vaginal bleeding and vaginal discharge.  Musculoskeletal: Negative for arthralgias and myalgias.  Skin: Negative for color change.  Allergic/Immunologic: Negative for immunocompromised state.  Neurological: Negative for weakness and numbness.  Psychiatric/Behavioral: Negative for confusion.  10 Systems reviewed and are negative for acute change except as noted in the HPI.    Physical Exam Updated Vital Signs BP 124/72 (BP Location: Left Arm)   Pulse 99   Temp 98.7 F (37.1 C) (Oral)   Resp 16   Ht 5\' 3"  (1.6 m)   Wt 165 lb (74.8 kg)   SpO2 100%   BMI 29.23 kg/m   Physical Exam  Constitutional: She is oriented to person, place, and time. Vital signs are normal. She appears well-developed and well-nourished.  Non-toxic appearance. No distress.  Afebrile, nontoxic, NAD  HENT:  Head: Normocephalic and atraumatic.  Mouth/Throat: Oropharynx is clear and moist and mucous membranes are normal.  Eyes: Conjunctivae and EOM are normal. Right eye exhibits no discharge. Left eye exhibits no discharge.  Neck: Normal range of motion. Neck supple.  Cardiovascular: Normal rate, regular rhythm, normal heart sounds and intact distal pulses.  Exam reveals no gallop and no friction rub.   No murmur heard. Pulmonary/Chest: Effort normal and breath sounds normal. No respiratory distress. She has no decreased breath  sounds. She has no wheezes. She has no rhonchi. She has no rales.  Abdominal: Soft. Normal appearance and bowel sounds are normal. She exhibits no distension. There is tenderness in the suprapubic area and left lower quadrant. There is no rigidity, no rebound, no guarding, no CVA tenderness, no tenderness at McBurney's point and negative Murphy's sign.  Soft, non-distended, +BS throughout, with mild suprapubic and LLQ TTP, no r/g/r, neg murphy's, neg mcburney's, no CVA TTP  Genitourinary: Vagina normal and uterus normal. Pelvic exam was performed with patient supine. There is no rash, tenderness or lesion on the right labia. There is no rash, tenderness or lesion on the left labia. Cervix exhibits discharge. Cervix exhibits no motion tenderness and no friability. Right adnexum displays no mass, no tenderness and no fullness. Left adnexum displays tenderness. Left adnexum displays no mass and no fullness. No erythema, tenderness or bleeding in the vagina. No vaginal discharge found.  Genitourinary Comments: Chaperone present for exam. No rashes, lesions, or tenderness to external genitalia. No erythema, injury, or tenderness to vaginal mucosa. No vaginal discharge or bleeding within vaginal vault. No adnexal masses or fullness, no right adnexal tenderness, but with mild L adnexal TTP. No CMT or cervical friability, with scant  whiteish/clear physiologic discharge from cervical os. Cervical os is closed. Uterus non-deviated, mobile, nonTTP, and without enlargement.    Musculoskeletal: Normal range of motion.  Neurological: She is alert and oriented to person, place, and time. She has normal strength. No sensory deficit.  Skin: Skin is warm, dry and intact. No rash noted.  Psychiatric: She has a normal mood and affect.  Nursing note and vitals reviewed.    ED Treatments / Results  DIAGNOSTIC STUDIES:  Oxygen Saturation is 100% on RA, normal by my interpretation.    COORDINATION OF CARE:  1:59 AM  Discussed treatment plan with pt at bedside and pt agreed to plan.   Labs (all labs ordered are listed, but only abnormal results are displayed) Labs Reviewed  WET PREP, GENITAL - Abnormal; Notable for the following:       Result Value   Clue Cells Wet Prep HPF POC PRESENT (*)    WBC, Wet Prep HPF POC MODERATE (*)    All other components within normal limits  URINALYSIS, ROUTINE W REFLEX MICROSCOPIC - Abnormal; Notable for the following:    APPearance HAZY (*)    Ketones, ur 5 (*)    All other components within normal limits  CBC WITH DIFFERENTIAL/PLATELET - Abnormal; Notable for the following:    Hemoglobin 9.0 (*)    HCT 29.0 (*)    MCV 72.0 (*)    MCH 22.3 (*)    RDW 17.7 (*)    All other components within normal limits  COMPREHENSIVE METABOLIC PANEL - Abnormal; Notable for the following:    Potassium 3.3 (*)    Glucose, Bld 100 (*)    Total Protein 8.4 (*)    ALT 11 (*)    Total Bilirubin 0.2 (*)    All other components within normal limits  RPR  HIV ANTIBODY (ROUTINE TESTING)  POC URINE PREG, ED  GC/CHLAMYDIA PROBE AMP ([place]) NOT AT [facility]    EKG  EKG Interpretation None       Radiology US Transvaginal Non-ob  Result Date: 11/13/2016 CLINICAL DATA:  Left greater than right pelvic pain. Dysuria. Pain since last night. EXAM: TRANSABDOMINAL AND TRANSVAGINAL ULTRASOUND OF PELVIS DOPPLER ULTRASOUND OF OVARIES TECHNIQUE: Both transabdominal and transvaginal ultrasound examinations of the pelvis were performed. Transabdominal technique was performed for global imaging of the pelvis including uterus, ovaries, adnexal regions, and pelvic cul-de-sac. It was necessary to proceed with endovaginal exam following the transabdominal exam to visualize the ovaries. Color and duplex Doppler ultrasound was utilized to evaluate blood flow to the ovaries. COMPARISON:  None. FINDINGS: Uterus Measurements: 8.1 x 3.8 x 5.1 cm. Uterus is anteverted. No fibroids or other mass visualized.  Endometrium Thickness: 9.2 mm.  No focal abnormality visualized. Right ovary Measurements: 3.1 x 1.9 x 2.1 cm. Normal appearance/no adnexal mass. Left ovary Measurements: 4.2 x 3.1 x 2.4 cm. Normal appearance/no adnexal mass. Small corpus luteal cyst is suggested. Pulsed Doppler evaluation of both ovaries demonstrates normal low-resistance arterial and venous waveforms. Flow is demonstrated within both ovaries on color flow Doppler imaging. Other findings Small amount of free fluid in the pelvis, likely physiologic. IMPRESSION: Normal ultrasound appearance of the uterus and ovaries. Probable corpus luteal cyst on the left. Small amount of free fluid is likely physiologic. No evidence of ovarian torsion. Electronically Signed   By: Burman Nieves M.D.   On: 11/13/2016 04:01   US Pelvis Complete  Result Date: 11/13/2016 CLINICAL DATA:  Left greater than right pelvic pain. Dysuria.  Pain since last night. EXAM: TRANSABDOMINAL AND TRANSVAGINAL ULTRASOUND OF PELVIS DOPPLER ULTRASOUND OF OVARIES TECHNIQUE: Both transabdominal and transvaginal ultrasound examinations of the pelvis were performed. Transabdominal technique was performed for global imaging of the pelvis including uterus, ovaries, adnexal regions, and pelvic cul-de-sac. It was necessary to proceed with endovaginal exam following the transabdominal exam to visualize the ovaries. Color and duplex Doppler ultrasound was utilized to evaluate blood flow to the ovaries. COMPARISON:  None. FINDINGS: Uterus Measurements: 8.1 x 3.8 x 5.1 cm. Uterus is anteverted. No fibroids or other mass visualized. Endometrium Thickness: 9.2 mm.  No focal abnormality visualized. Right ovary Measurements: 3.1 x 1.9 x 2.1 cm. Normal appearance/no adnexal mass. Left ovary Measurements: 4.2 x 3.1 x 2.4 cm. Normal appearance/no adnexal mass. Small corpus luteal cyst is suggested. Pulsed Doppler evaluation of both ovaries demonstrates normal low-resistance arterial and venous  waveforms. Flow is demonstrated within both ovaries on color flow Doppler imaging. Other findings Small amount of free fluid in the pelvis, likely physiologic. IMPRESSION: Normal ultrasound appearance of the uterus and ovaries. Probable corpus luteal cyst on the left. Small amount of free fluid is likely physiologic. No evidence of ovarian torsion. Electronically Signed   By: Burman NievesWilliam  Stevens M.D.   On: 11/13/2016 04:01   [place] Art/ven Flow Abd Pelv Doppler  Result Date: 11/13/2016 CLINICAL DATA:  Left greater than right pelvic pain. Dysuria. Pain since last night. EXAM: TRANSABDOMINAL AND TRANSVAGINAL ULTRASOUND OF PELVIS DOPPLER ULTRASOUND OF OVARIES TECHNIQUE: Both transabdominal and transvaginal ultrasound examinations of the pelvis were performed. Transabdominal technique was performed for global imaging of the pelvis including uterus, ovaries, adnexal regions, and pelvic cul-de-sac. It was necessary to proceed with endovaginal exam following the transabdominal exam to visualize the ovaries. Color and duplex Doppler ultrasound was utilized to evaluate blood flow to the ovaries. COMPARISON:  None. FINDINGS: Uterus Measurements: 8.1 x 3.8 x 5.1 cm. Uterus is anteverted. No fibroids or other mass visualized. Endometrium Thickness: 9.2 mm.  No focal abnormality visualized. Right ovary Measurements: 3.1 x 1.9 x 2.1 cm. Normal appearance/no adnexal mass. Left ovary Measurements: 4.2 x 3.1 x 2.4 cm. Normal appearance/no adnexal mass. Small corpus luteal cyst is suggested. Pulsed Doppler evaluation of both ovaries demonstrates normal low-resistance arterial and venous waveforms. Flow is demonstrated within both ovaries on color flow Doppler imaging. Other findings Small amount of free fluid in the pelvis, likely physiologic. IMPRESSION: Normal ultrasound appearance of the uterus and ovaries. Probable corpus luteal cyst on the left. Small amount of free fluid is likely physiologic. No evidence of ovarian torsion.  Electronically Signed   By: Burman NievesWilliam  Stevens M.D.   On: 11/13/2016 04:01    Procedures Procedures (including critical care time)  Medications Ordered in ED Medications  potassium chloride SA (K-DUR,KLOR-CON) CR tablet 40 mEq (not administered)     Initial Impression / Assessment and Plan / ED Course  I have reviewed the triage vital signs and the nursing notes.  Pertinent labs & imaging results that were available during my care of the patient were reviewed by me and considered in my medical decision making (see chart for details).     26 y.o. female here with gradual onset lower abd pain today, worse x3 hrs. Some discomfort with urination, some mild nausea earlier but none ongoing. On exam, mild suprapubic and LLQ TTP, nonperitoneal. Will proceed with pelvic exam, STD check, and labs, and then reassess after; will decide on imaging based on lab findings. Pt declines wanting anything for  pain, will reassess shortly.   2:11 AM Upreg neg. Pelvic reveals some scant physiologic discharge at the cervix, no significant discharge otherwise; no CMT. Mild L adnexal tenderness. Doubt need for empiric GC/CT tx at this time. Will proceed with pelvic U/S to r/o cyst vs torsion vs TOA/PID vs other etiology of symptoms. Pt continues to decline wanting anything for her symptoms. Remainder of work up in process. Will reassess shortly.   4:07 AM Pt continues to feel improved from onset, no ongoing pain.  CBC w/diff with mild improving anemia, will start on iron supplementation if not taking already. CMP showing mildly low K 3.3, will replete orally here. U/A unremarkable without evidence of UTI. Wet prep with +clue cells, given scant whiteish/clear discharge on exam, will treat for BV. U/S shows small corpus luteal cyst on L ovary, likely from ovulation; likely cause of symptoms. Advised use of tylenol/motrin/heat use as needed for pain. Staying well hydrated. Discussed abstinence until results of STD  testing return. F/up with health dept for future STD concerns. Safe sex encouraged, and discussed having partners tested and treated before re-engaging in intercourse.  F/up with OBGYN in 5-7 days for recheck of symptoms and ongoing management of her pelvic pain. I explained the diagnosis and have given explicit precautions to return to the ER including for any other new or worsening symptoms. The patient understands and accepts the medical plan as it's been dictated and I have answered their questions. Discharge instructions concerning home care and prescriptions have been given. The patient is STABLE and is discharged to home in good condition.   I personally performed the services described in this documentation, which was scribed in my presence. The recorded information has been reviewed and is accurate.   Final Clinical Impressions(s) / ED Diagnoses   Final diagnoses:  Left adnexal tenderness  Pelvic pain in female  BV (bacterial vaginosis)  Chronic anemia  Hypokalemia  Corpus luteum cyst of left ovary    New Prescriptions New Prescriptions   FERROUS SULFATE 325 (65 FE) MG TABLET    Take 1 tablet (325 mg total) by mouth daily with breakfast. TAKE WITH ORANGE JUICE   METRONIDAZOLE (FLAGYL) 500 MG TABLET    Take 1 tablet (500 mg total) by mouth 2 (two) times daily. One po bid x 7 days     [address], PA-C 11/13/16 1610    Linwood Dibbles, MD 11/13/16 [phone]

## 2016-11-14 LAB — CERVICOVAGINAL ANCILLARY ONLY
CHLAMYDIA, DNA PROBE: NEGATIVE
Neisseria Gonorrhea: NEGATIVE

## 2016-11-14 LAB — RPR: RPR Ser Ql: NONREACTIVE

## 2017-08-08 IMAGING — US US MFM FETAL BPP W/O NON-STRESS
1 series · 10 of 10 positions shown · non-contrast
Comparison: none

[Series 1: us mfm fetal bpp w/o non-stress · 10 acquisitions, 10 frames shown]
[im 1/10]
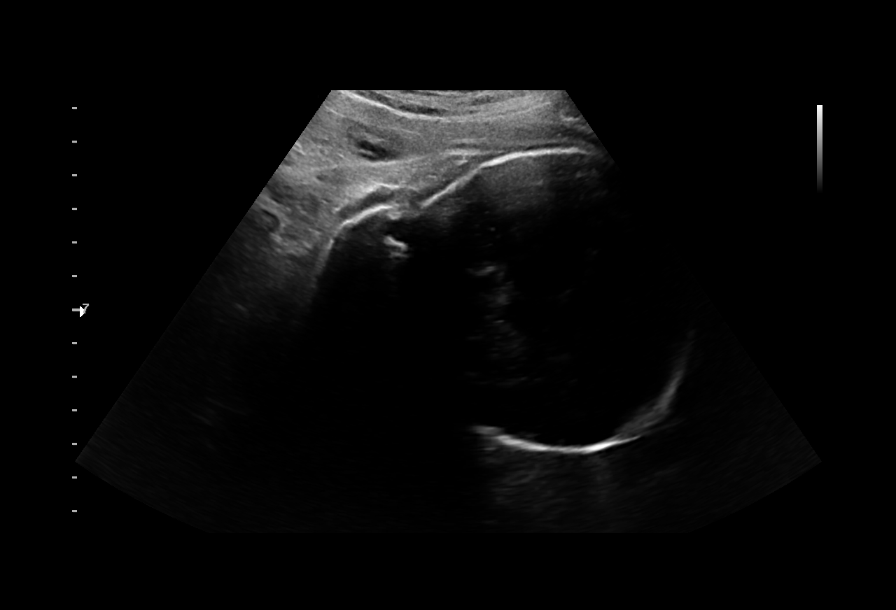
[im 2/10]
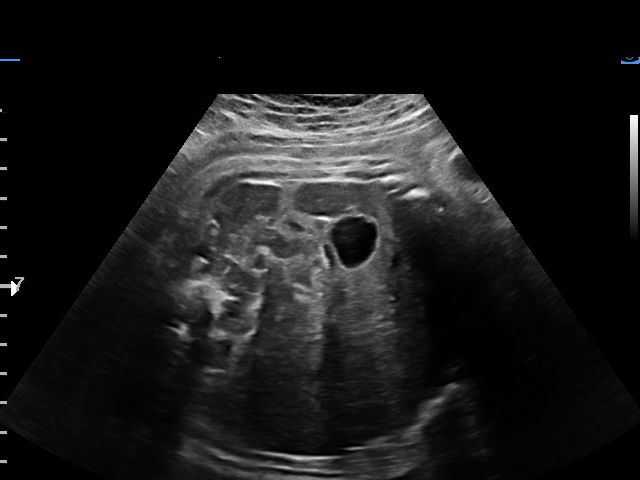
[im 3/10]
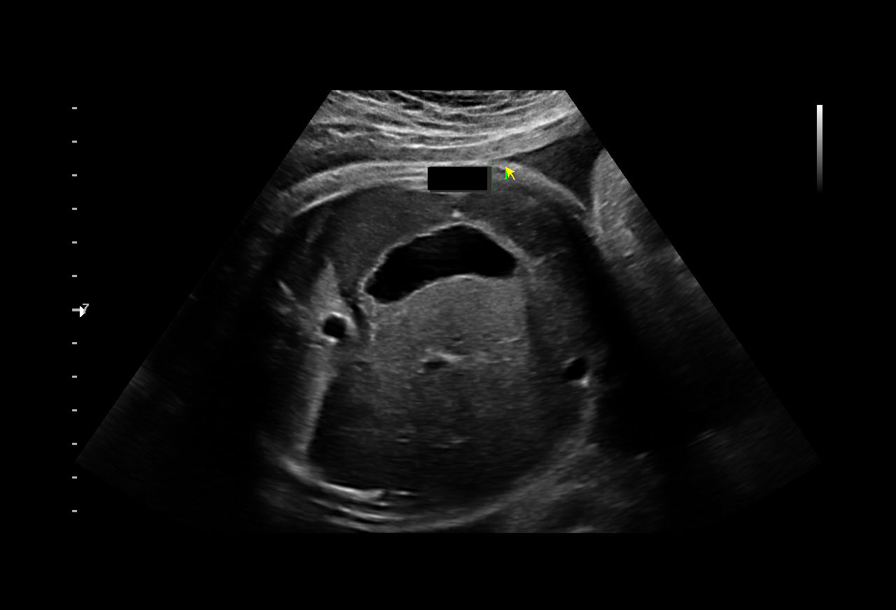
[im 4/10]
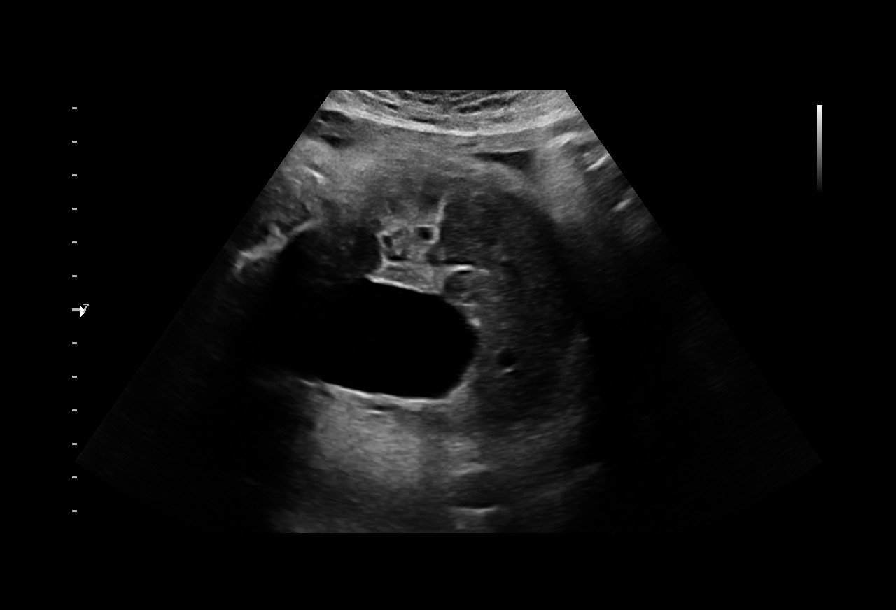
[im 5/10]
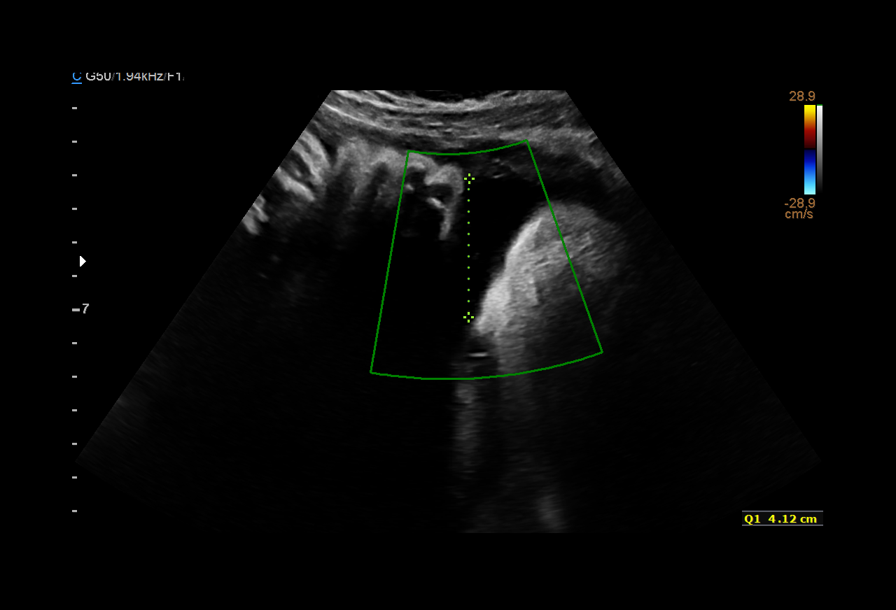
[im 6/10]
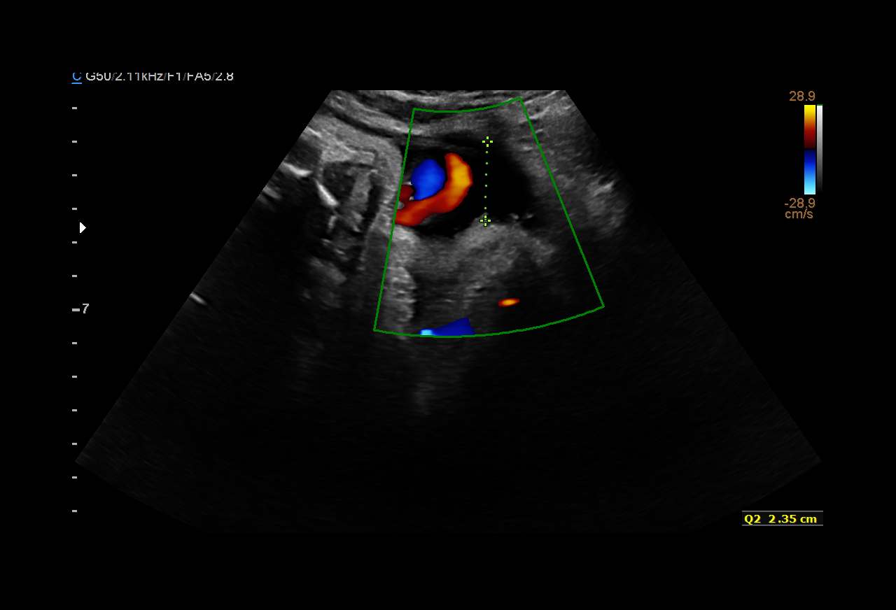
[im 7/10]
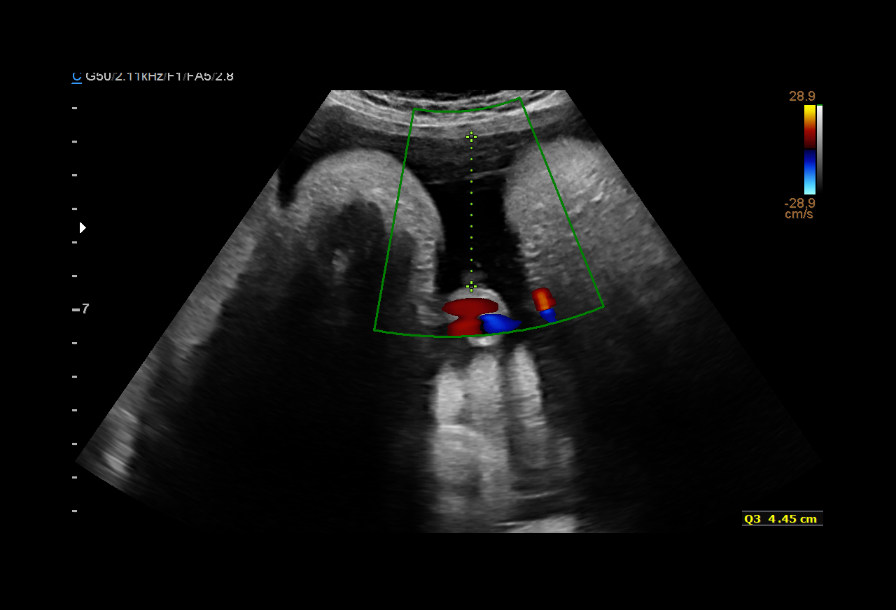
[im 8/10]
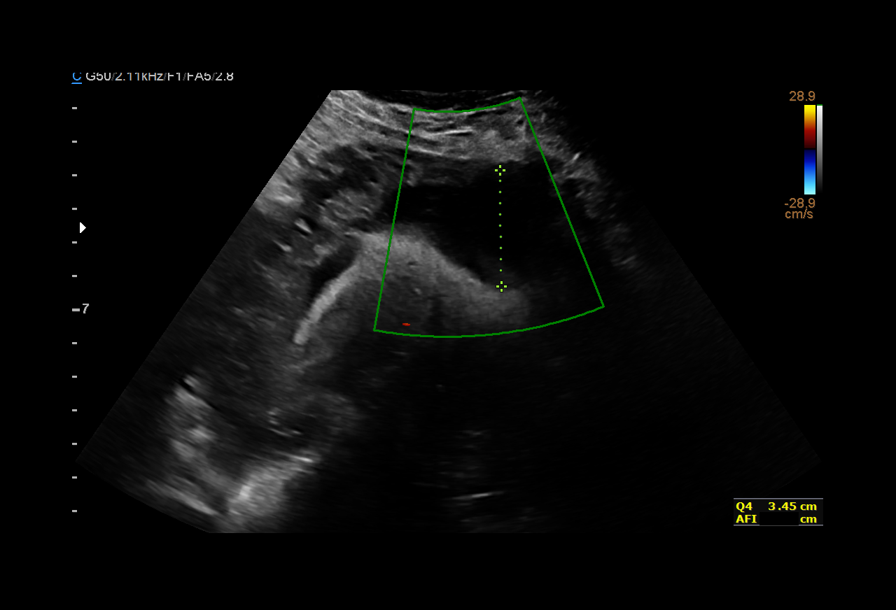
[im 9/10]
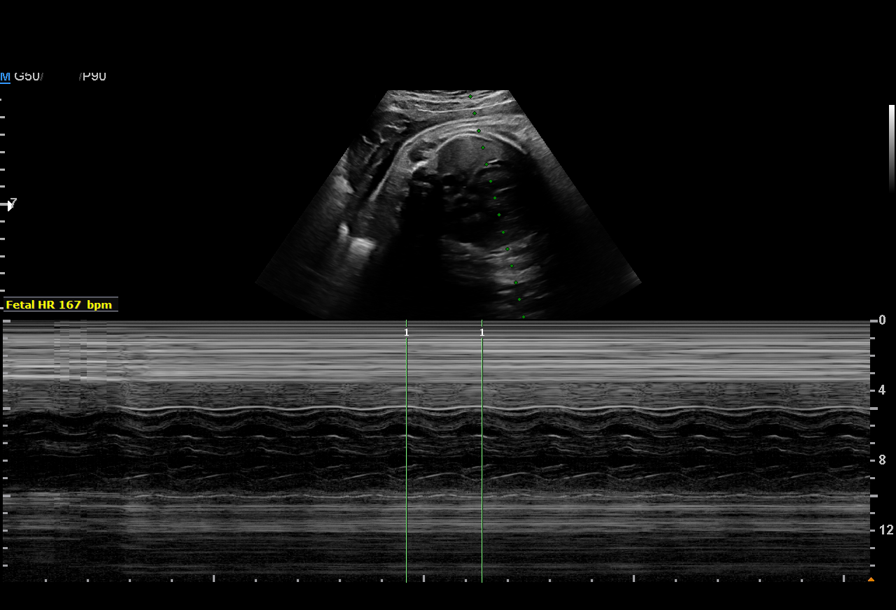
[im 10/10]
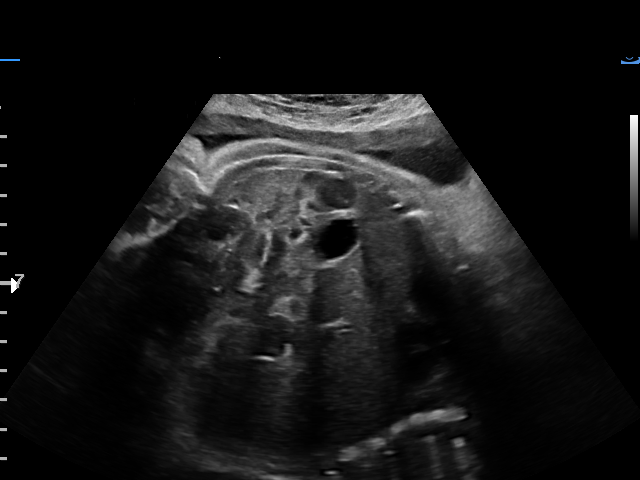

[10 of 10 positions shown; findings below may reference images not displayed]

#[id number]
Attending:        [employer]        Secondary Phy.:   [facility]-
MAU/Triage

1  KHETAG DADAEV            [id number]      [phone]     [phone]
Indications

38 weeks gestation of pregnancy
Decreased fetal movements, thrid trimester,
fetus 1

OB History

Gravidity:    1
Fetal Evaluation

Num Of Fetuses:     1
Fetal Heart         167
Rate(bpm):
Cardiac Activity:   Observed
Presentation:       Cephalic

Amniotic Fluid
AFI FV:      Subjectively within normal limits

AFI Sum(cm)     %Tile       Largest Pocket(cm)
14.37           55

RUQ(cm)       RLQ(cm)       LUQ(cm)        LLQ(cm)
4.12
Biophysical Evaluation

Amniotic F.V:   Within normal limits       F. Tone:        Observed
F. Movement:    Observed                   Score:          [DATE]
F. Breathing:   Observed
Gestational Age

Clinical EDD:  38w 1d                                        EDD:   07/23/16
Best:          38w 1d    Det. By:   Clinical EDD             EDD:   07/23/16
Anatomy

Stomach:               Appears normal, left   Bladder:                Appears normal
sided
Cervix Uterus Adnexa

Cervix
Not visualized (advanced GA >51wks)
Impression

Single IUP at 38w 1d
Cephalic presentation
BPP [DATE]
Normal amniotic fluid volume
Recommendations

Follow-up ultrasounds as clinically indicated.

## 2017-11-23 IMAGING — US US TRANSVAGINAL NON-OB
1 series · 13 of 25 positions shown · non-contrast
Comparison: None.

CLINICAL DATA: Left greater than right pelvic pain. Dysuria. Pain
since last night.

EXAM:
TRANSABDOMINAL AND TRANSVAGINAL ULTRASOUND OF PELVIS
DOPPLER ULTRASOUND OF OVARIES
TECHNIQUE: Both transabdominal and transvaginal ultrasound examinations of the
pelvis were performed. Transabdominal technique was performed for
global imaging of the pelvis including uterus, ovaries, adnexal
regions, and pelvic cul-de-sac.
It was necessary to proceed with endovaginal exam following the
transabdominal exam to visualize the ovaries. Color and duplex
Doppler ultrasound was utilized to evaluate blood flow to the
ovaries.

[Series 1: us transvaginal non-ob · 0.18mm/px · 13 of 198 slices shown]
[im 1/198]
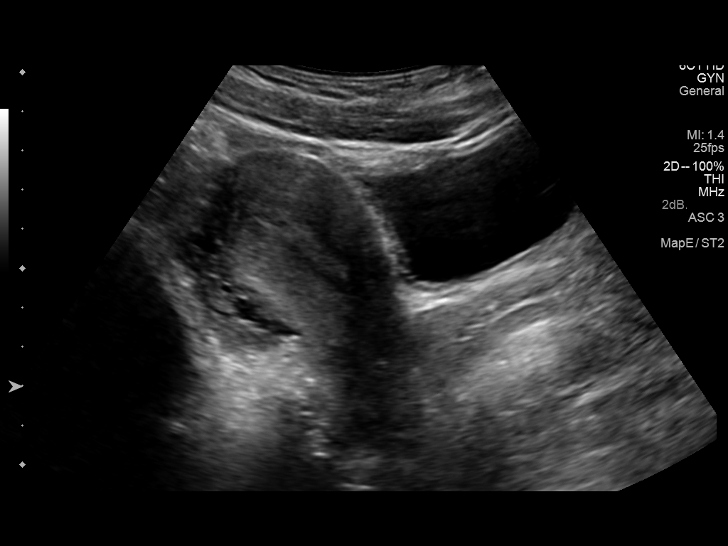
[im 17/198]
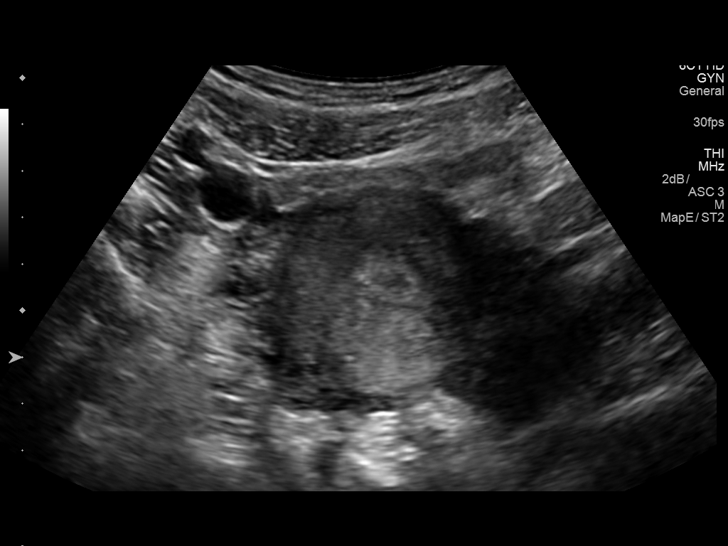
[im 33/198]
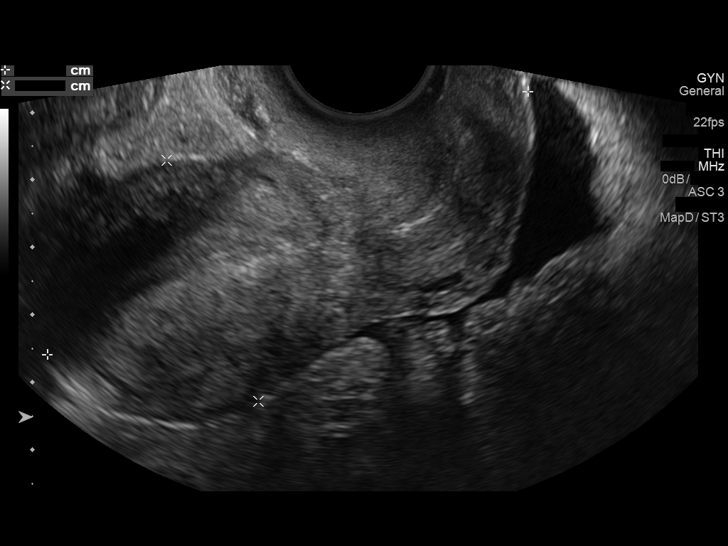
[im 50/198]
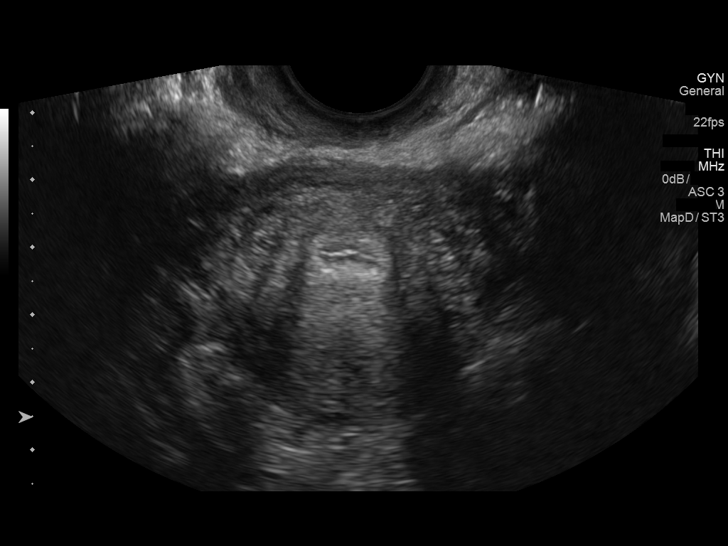
[im 66/198]
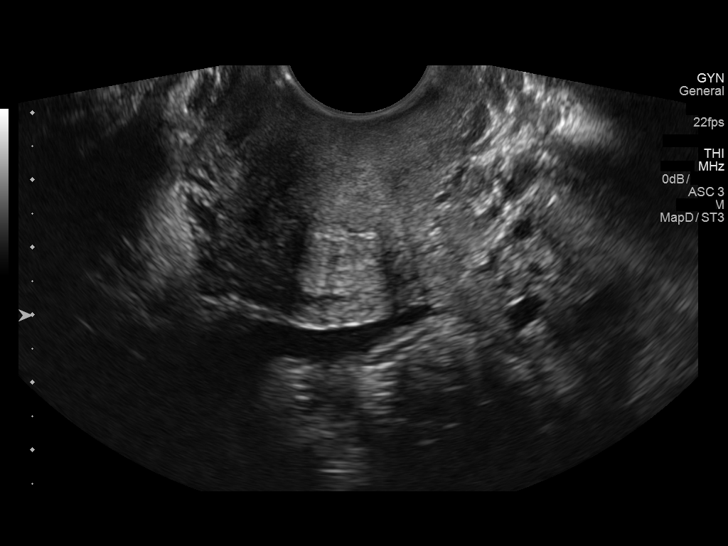
[im 83/198]
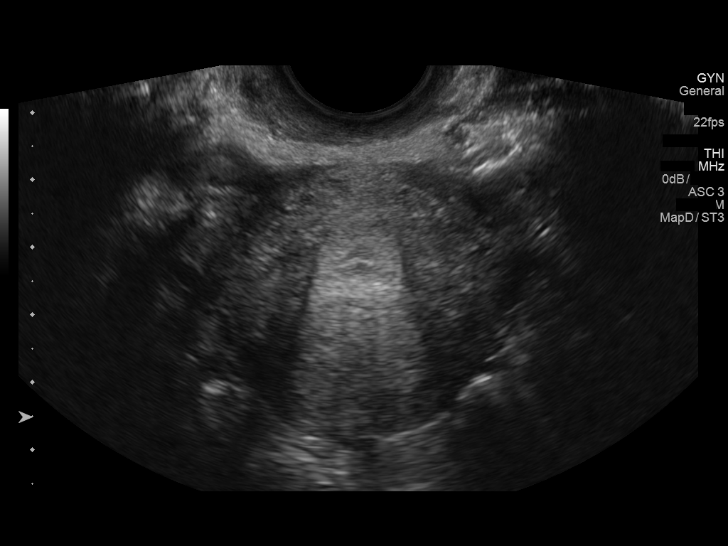
[im 99/198]
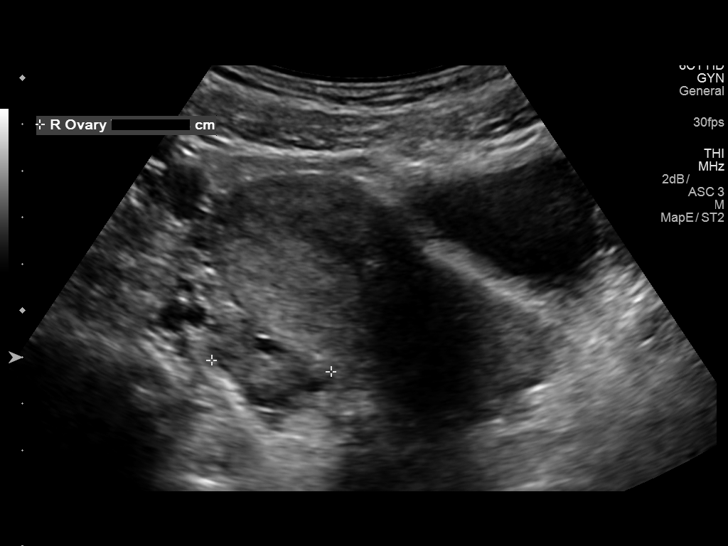
[im 115/198]
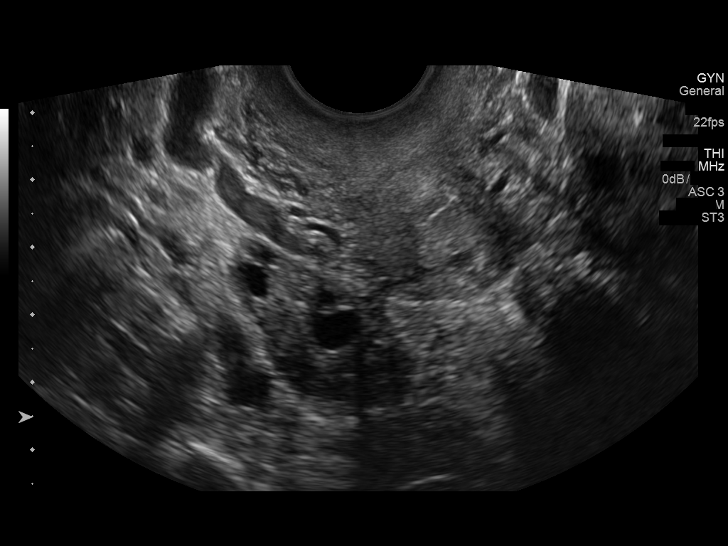
[im 132/198]
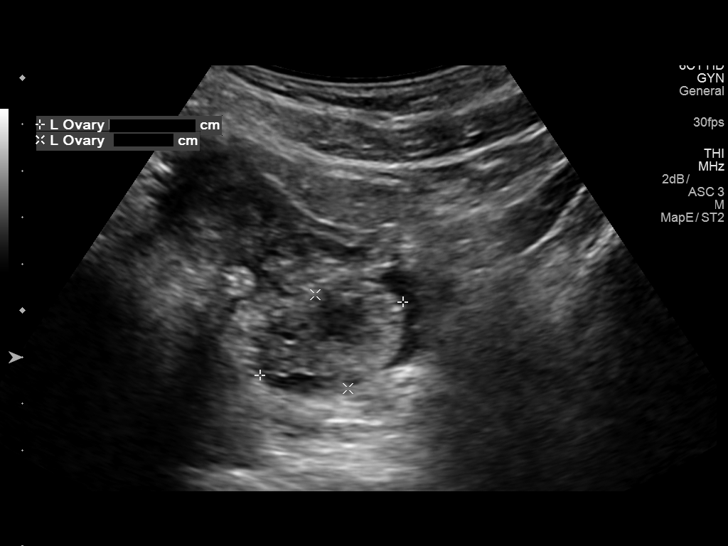
[im 148/198]
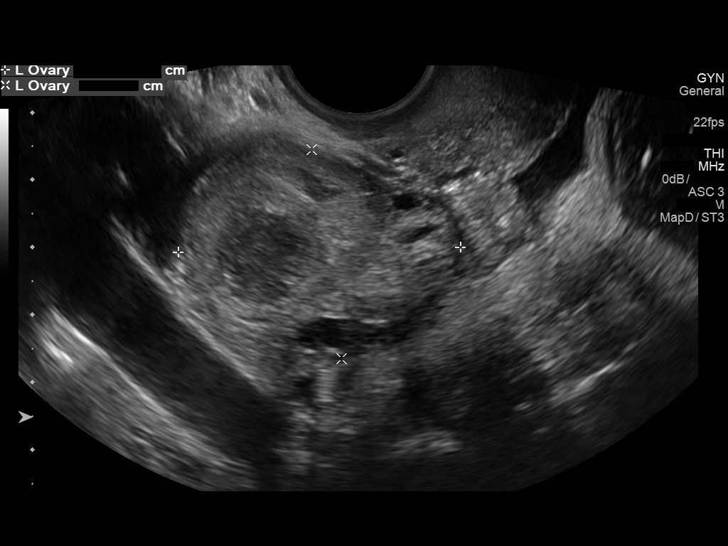
[im 165/198]
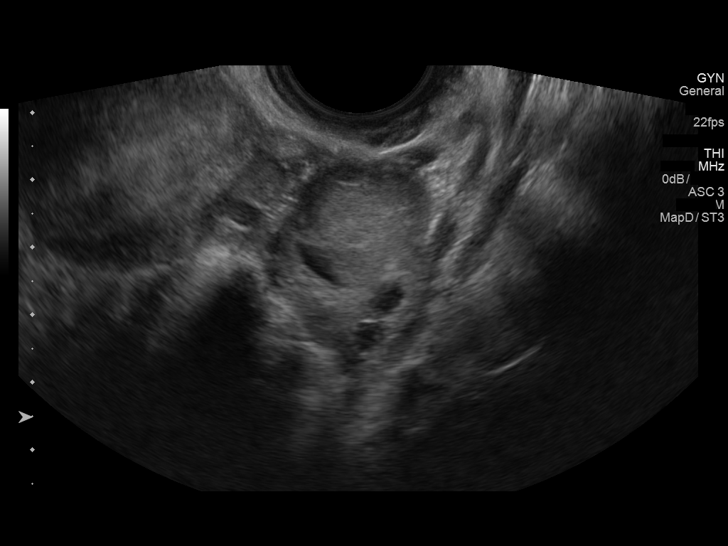
[im 181/198]
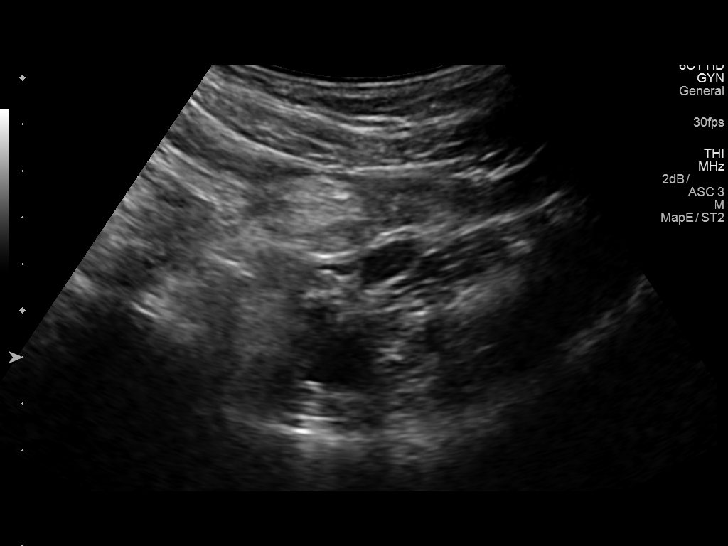
[im 198/198]
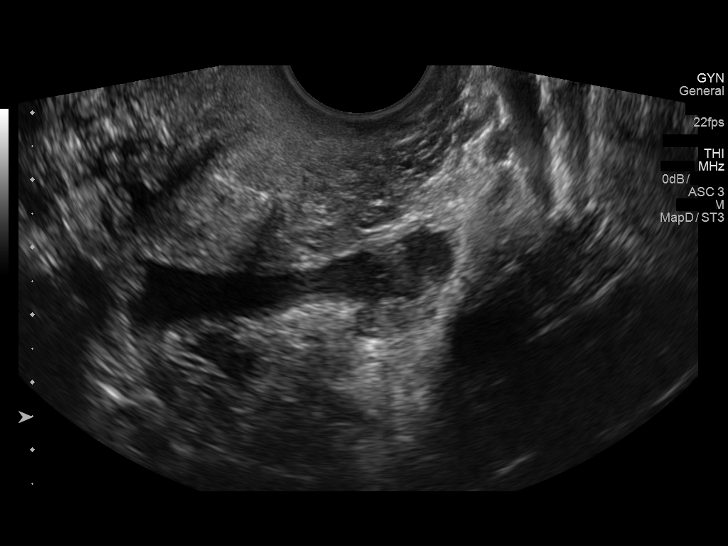

[13 of 25 positions shown; findings below may reference images not displayed]

FINDINGS: Uterus

Measurements: 8.1 x 3.8 x 5.1 cm. Uterus is anteverted. No fibroids
or other mass visualized.

Endometrium

Thickness: 9.2 mm.  No focal abnormality visualized.

Right ovary

Measurements: 3.1 x 1.9 x 2.1 cm. Normal appearance/no adnexal mass.

Left ovary

Measurements: 4.2 x 3.1 x 2.4 cm. Normal appearance/no adnexal mass.
Small corpus luteal cyst is suggested.

Pulsed Doppler evaluation of both ovaries demonstrates normal
low-resistance arterial and venous waveforms. Flow is demonstrated
within both ovaries on color flow Doppler imaging.

Other findings

Small amount of free fluid in the pelvis, likely physiologic.
IMPRESSION: Normal ultrasound appearance of the uterus and ovaries. Probable
corpus luteal cyst on the left. Small amount of free fluid is likely
physiologic. No evidence of ovarian torsion.

## 2020-02-26 DIAGNOSIS — Z419 Encounter for procedure for purposes other than remedying health state, unspecified: Secondary | ICD-10-CM | POA: Diagnosis not present

## 2020-03-28 DIAGNOSIS — Z419 Encounter for procedure for purposes other than remedying health state, unspecified: Secondary | ICD-10-CM | POA: Diagnosis not present

## 2020-04-28 DIAGNOSIS — Z419 Encounter for procedure for purposes other than remedying health state, unspecified: Secondary | ICD-10-CM | POA: Diagnosis not present

## 2020-05-28 DIAGNOSIS — Z419 Encounter for procedure for purposes other than remedying health state, unspecified: Secondary | ICD-10-CM | POA: Diagnosis not present

## 2020-06-28 DIAGNOSIS — Z419 Encounter for procedure for purposes other than remedying health state, unspecified: Secondary | ICD-10-CM | POA: Diagnosis not present

## 2020-07-28 DIAGNOSIS — Z419 Encounter for procedure for purposes other than remedying health state, unspecified: Secondary | ICD-10-CM | POA: Diagnosis not present

## 2020-08-28 DIAGNOSIS — Z419 Encounter for procedure for purposes other than remedying health state, unspecified: Secondary | ICD-10-CM | POA: Diagnosis not present

## 2020-09-28 DIAGNOSIS — Z419 Encounter for procedure for purposes other than remedying health state, unspecified: Secondary | ICD-10-CM | POA: Diagnosis not present

## 2020-10-26 DIAGNOSIS — Z419 Encounter for procedure for purposes other than remedying health state, unspecified: Secondary | ICD-10-CM | POA: Diagnosis not present

## 2020-11-26 DIAGNOSIS — Z419 Encounter for procedure for purposes other than remedying health state, unspecified: Secondary | ICD-10-CM | POA: Diagnosis not present

## 2020-12-26 DIAGNOSIS — Z419 Encounter for procedure for purposes other than remedying health state, unspecified: Secondary | ICD-10-CM | POA: Diagnosis not present

## 2021-01-26 DIAGNOSIS — Z419 Encounter for procedure for purposes other than remedying health state, unspecified: Secondary | ICD-10-CM | POA: Diagnosis not present

## 2021-02-25 DIAGNOSIS — Z419 Encounter for procedure for purposes other than remedying health state, unspecified: Secondary | ICD-10-CM | POA: Diagnosis not present

## 2021-03-28 DIAGNOSIS — Z419 Encounter for procedure for purposes other than remedying health state, unspecified: Secondary | ICD-10-CM | POA: Diagnosis not present

## 2021-04-28 DIAGNOSIS — Z419 Encounter for procedure for purposes other than remedying health state, unspecified: Secondary | ICD-10-CM | POA: Diagnosis not present

## 2021-05-28 DIAGNOSIS — Z419 Encounter for procedure for purposes other than remedying health state, unspecified: Secondary | ICD-10-CM | POA: Diagnosis not present

## 2021-06-28 DIAGNOSIS — Z419 Encounter for procedure for purposes other than remedying health state, unspecified: Secondary | ICD-10-CM | POA: Diagnosis not present

## 2021-07-28 DIAGNOSIS — Z419 Encounter for procedure for purposes other than remedying health state, unspecified: Secondary | ICD-10-CM | POA: Diagnosis not present

## 2021-08-28 DIAGNOSIS — Z419 Encounter for procedure for purposes other than remedying health state, unspecified: Secondary | ICD-10-CM | POA: Diagnosis not present

## 2021-09-28 DIAGNOSIS — Z419 Encounter for procedure for purposes other than remedying health state, unspecified: Secondary | ICD-10-CM | POA: Diagnosis not present

## 2021-10-26 DIAGNOSIS — Z419 Encounter for procedure for purposes other than remedying health state, unspecified: Secondary | ICD-10-CM | POA: Diagnosis not present

## 2021-11-26 DIAGNOSIS — Z419 Encounter for procedure for purposes other than remedying health state, unspecified: Secondary | ICD-10-CM | POA: Diagnosis not present

## 2021-12-26 DIAGNOSIS — Z419 Encounter for procedure for purposes other than remedying health state, unspecified: Secondary | ICD-10-CM | POA: Diagnosis not present

## 2022-01-26 DIAGNOSIS — Z419 Encounter for procedure for purposes other than remedying health state, unspecified: Secondary | ICD-10-CM | POA: Diagnosis not present

## 2022-02-25 DIAGNOSIS — Z419 Encounter for procedure for purposes other than remedying health state, unspecified: Secondary | ICD-10-CM | POA: Diagnosis not present

## 2022-03-28 DIAGNOSIS — Z419 Encounter for procedure for purposes other than remedying health state, unspecified: Secondary | ICD-10-CM | POA: Diagnosis not present

## 2022-04-28 DIAGNOSIS — Z419 Encounter for procedure for purposes other than remedying health state, unspecified: Secondary | ICD-10-CM | POA: Diagnosis not present

## 2022-05-28 DIAGNOSIS — Z419 Encounter for procedure for purposes other than remedying health state, unspecified: Secondary | ICD-10-CM | POA: Diagnosis not present

## 2022-06-28 DIAGNOSIS — Z419 Encounter for procedure for purposes other than remedying health state, unspecified: Secondary | ICD-10-CM | POA: Diagnosis not present

## 2022-07-28 DIAGNOSIS — Z419 Encounter for procedure for purposes other than remedying health state, unspecified: Secondary | ICD-10-CM | POA: Diagnosis not present

## 2022-08-28 DIAGNOSIS — Z419 Encounter for procedure for purposes other than remedying health state, unspecified: Secondary | ICD-10-CM | POA: Diagnosis not present

## 2022-09-28 DIAGNOSIS — Z419 Encounter for procedure for purposes other than remedying health state, unspecified: Secondary | ICD-10-CM | POA: Diagnosis not present

## 2022-10-27 DIAGNOSIS — Z419 Encounter for procedure for purposes other than remedying health state, unspecified: Secondary | ICD-10-CM | POA: Diagnosis not present

## 2022-11-27 DIAGNOSIS — Z419 Encounter for procedure for purposes other than remedying health state, unspecified: Secondary | ICD-10-CM | POA: Diagnosis not present

## 2022-12-27 DIAGNOSIS — Z419 Encounter for procedure for purposes other than remedying health state, unspecified: Secondary | ICD-10-CM | POA: Diagnosis not present

## 2023-01-27 DIAGNOSIS — Z419 Encounter for procedure for purposes other than remedying health state, unspecified: Secondary | ICD-10-CM | POA: Diagnosis not present

## 2023-02-26 DIAGNOSIS — Z419 Encounter for procedure for purposes other than remedying health state, unspecified: Secondary | ICD-10-CM | POA: Diagnosis not present
